# Patient Record
Sex: Female | Born: 1990 | Race: Black or African American | Hispanic: No | Marital: Single | Smoking: Current every day smoker
Health system: Southern US, Community
[De-identification: ages and names within clinical notes are randomized; demographics above are authoritative.]

## PROBLEM LIST (undated history)

## (undated) ENCOUNTER — Inpatient Hospital Stay (HOSPITAL_COMMUNITY): Payer: Self-pay

## (undated) DIAGNOSIS — A539 Syphilis, unspecified: Secondary | ICD-10-CM

## (undated) DIAGNOSIS — F419 Anxiety disorder, unspecified: Secondary | ICD-10-CM

## (undated) DIAGNOSIS — K219 Gastro-esophageal reflux disease without esophagitis: Secondary | ICD-10-CM

## (undated) DIAGNOSIS — A549 Gonococcal infection, unspecified: Secondary | ICD-10-CM

## (undated) DIAGNOSIS — F32A Depression, unspecified: Secondary | ICD-10-CM

## (undated) DIAGNOSIS — A599 Trichomoniasis, unspecified: Secondary | ICD-10-CM

## (undated) DIAGNOSIS — F99 Mental disorder, not otherwise specified: Secondary | ICD-10-CM

## (undated) DIAGNOSIS — R519 Headache, unspecified: Secondary | ICD-10-CM

## (undated) HISTORY — PX: TOOTH EXTRACTION: SUR596

## (undated) HISTORY — DX: Anxiety disorder, unspecified: F41.9

## (undated) HISTORY — DX: Gastro-esophageal reflux disease without esophagitis: K21.9

## (undated) HISTORY — DX: Headache, unspecified: R51.9

## (undated) HISTORY — DX: Depression, unspecified: F32.A

---
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
## 2009-05-18 ENCOUNTER — Telehealth: Payer: Self-pay | Admitting: *Deleted

## 2010-05-23 ENCOUNTER — Encounter: Payer: Self-pay | Admitting: Family Medicine

## 2010-06-03 NOTE — Progress Notes (Signed)
Summary: resch?  Phone Note Call from Patient Call back at [phone]   Caller: Patient Summary of Call: missed appt this morning and wants to know if she can reschedule Initial call taken by: De Nurse,  May 18, 2009 2:06 PM  Follow-up for Phone Call        Sure but please let her know that she is now on probation status and will be getting a letter from [place] soon about this. Follow-up by: Dennison Nancy RN,  May 18, 2009 4:26 PM
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
## 2010-11-16 ENCOUNTER — Inpatient Hospital Stay (HOSPITAL_COMMUNITY): Payer: Self-pay

## 2010-11-16 ENCOUNTER — Inpatient Hospital Stay (HOSPITAL_COMMUNITY)
Admission: AD | Admit: 2010-11-16 | Discharge: 2010-11-16 | Disposition: A | Discharge status: Home / Self Care | Payer: Self-pay | Source: Ambulatory Visit | Attending: Obstetrics & Gynecology | Admitting: Obstetrics & Gynecology

## 2010-11-16 ENCOUNTER — Encounter (HOSPITAL_COMMUNITY): Payer: Self-pay

## 2010-11-16 DIAGNOSIS — A599 Trichomoniasis, unspecified: Secondary | ICD-10-CM

## 2010-11-16 DIAGNOSIS — O99891 Other specified diseases and conditions complicating pregnancy: Secondary | ICD-10-CM | POA: Insufficient documentation

## 2010-11-16 DIAGNOSIS — R109 Unspecified abdominal pain: Secondary | ICD-10-CM

## 2010-11-16 DIAGNOSIS — R1031 Right lower quadrant pain: Secondary | ICD-10-CM | POA: Insufficient documentation

## 2010-11-16 HISTORY — DX: Trichomoniasis, unspecified: A59.9

## 2010-11-16 HISTORY — DX: Syphilis, unspecified: A53.9

## 2010-11-16 HISTORY — DX: Gonococcal infection, unspecified: A54.9

## 2010-11-16 LAB — DIFFERENTIAL
Basophils Absolute: 0 10*3/uL (ref 0.0–0.1)
Lymphocytes Relative: 46 % (ref 12–46)
Lymphs Abs: 2.2 10*3/uL (ref 0.7–4.0)
Neutro Abs: 1.8 10*3/uL (ref 1.7–7.7)

## 2010-11-16 LAB — URINALYSIS, ROUTINE W REFLEX MICROSCOPIC
Bilirubin Urine: NEGATIVE
Nitrite: NEGATIVE
Specific Gravity, Urine: >1.03 — ABNORMAL HIGH (ref 1.005–1.030)
Urobilinogen, UA: 1 mg/dL (ref 0.0–1.0)
pH: 6 (ref 5.0–8.0)

## 2010-11-16 LAB — POCT PREGNANCY, URINE: Preg Test, Ur: POSITIVE

## 2010-11-16 LAB — HCG, QUANTITATIVE, PREGNANCY: hCG, Beta Chain, Quant, S: 104915 m[IU]/mL — ABNORMAL HIGH (ref <5)

## 2010-11-16 LAB — URINE MICROSCOPIC-ADD ON

## 2010-11-16 LAB — WET PREP, GENITAL
Trich, Wet Prep: NONE SEEN
Yeast Wet Prep HPF POC: NONE SEEN

## 2010-11-16 LAB — CBC
Platelets: 235 10*3/uL (ref 150–400)
RBC: 3.88 MIL/uL (ref 3.87–5.11)
RDW: 14.2 % (ref 11.5–15.5)
WBC: 4.6 10*3/uL (ref 4.0–10.5)

## 2010-11-16 MED ORDER — METRONIDAZOLE 500 MG PO TABS: 500.0000 mg | ORAL_TABLET | Freq: Two times a day (BID) | ORAL | Status: AC

## 2010-11-16 MED ORDER — PROMETHAZINE HCL 25 MG PO TABS: 25.0000 mg | ORAL_TABLET | Freq: Four times a day (QID) | ORAL | Status: DC | PRN
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
## 2010-11-16 NOTE — Progress Notes (Signed)
Patient is here with c/o nausea and lower abdominal cramping. She denies any vaginal spotting, bleeding or discharge.

## 2010-11-16 NOTE — ED Provider Notes (Addendum)
History     Chief Complaint  Patient presents with  . Abdominal Pain   HPI The patient states that about a month ago she went to the ER in [place] and had a pos pregnancy test. Then they did blood work and told her she may be 6 or 8 wks. Pregnant. No ultrasound at that time. Patient has not started [facility]. Here today with abdominal cramping in the RLQ that has been off and on for the past couple days. Denies vaginal bleeding.  No past medical history on file.  No past surgical history on file.  No family history on file.  History  Substance Use Topics  . Smoking status: Not on file  . Smokeless tobacco: Not on file  . Alcohol Use: Not on file    OB History    Grav Para Term Preterm Abortions TAB SAB Ect Mult Living   1               Review of Systems  Constitutional: Positive for fatigue. Negative for fever, chills and diaphoresis.  HENT: Negative for ear pain, congestion, sore throat, facial swelling, neck pain, neck stiffness, dental problem and sinus pressure.   Eyes: Negative for photophobia, pain and discharge.  Respiratory: Positive for cough. Negative for chest tightness and wheezing.   Cardiovascular: Negative for chest pain and leg swelling.  Gastrointestinal: Negative for nausea, vomiting, abdominal pain, diarrhea, constipation and abdominal distention.  Genitourinary: Negative for dysuria, frequency, flank pain and difficulty urinating.  Musculoskeletal: Positive for back pain. Negative for myalgias and gait problem.  Skin: Negative for color change and rash.  Neurological: Negative for dizziness, speech difficulty, weakness, light-headedness, numbness and headaches.  Psychiatric/Behavioral: Negative for confusion and agitation.    Physical Exam  BP 123/68  Pulse 75  Temp(Src) 98.6 F (37 C) (Oral)  Resp 16  Ht 5\' 8"  (1.727 m)  Wt 217 lb 3.2 oz (98.521 kg)  BMI 33.03 kg/m2  SpO2 99%  LMP 09/15/2010  Physical Exam  Nursing note and vitals  reviewed. Constitutional: She is oriented to person, place, and time. She appears well-developed and well-nourished.  HENT:  Head: Normocephalic.  Eyes: EOM are normal.  Neck: Neck supple.  Pulmonary/Chest: Effort normal.  Abdominal: Soft. There is no tenderness.  Genitourinary: Uterus is enlarged. Right adnexum displays tenderness. Vaginal discharge found.       Uterus approx. 10 week size.  Musculoskeletal: Normal range of motion.  Neurological: She is alert and oriented to person, place, and time. No cranial nerve deficit.  Skin: Skin is warm and dry.    ED Course  Procedures  MDM Pt. With abdominal cramping. Ultrasound shows IUP with cardiac activiy at 10 wks. Gest. Pt. To start [facility].       [place], [place] 12/02/10 [phone]

## 2011-01-16 ENCOUNTER — Encounter (HOSPITAL_COMMUNITY): Payer: Self-pay | Admitting: *Deleted

## 2011-01-16 ENCOUNTER — Inpatient Hospital Stay (HOSPITAL_COMMUNITY)
Admission: AD | Admit: 2011-01-16 | Discharge: 2011-01-16 | Disposition: A | Discharge status: Home / Self Care | Payer: Self-pay | Source: Ambulatory Visit | Attending: Family Medicine | Admitting: Family Medicine

## 2011-01-16 DIAGNOSIS — A5901 Trichomonal vulvovaginitis: Secondary | ICD-10-CM | POA: Insufficient documentation

## 2011-01-16 DIAGNOSIS — J069 Acute upper respiratory infection, unspecified: Secondary | ICD-10-CM | POA: Insufficient documentation

## 2011-01-16 DIAGNOSIS — R05 Cough: Secondary | ICD-10-CM | POA: Insufficient documentation

## 2011-01-16 DIAGNOSIS — O98819 Other maternal infectious and parasitic diseases complicating pregnancy, unspecified trimester: Secondary | ICD-10-CM | POA: Insufficient documentation

## 2011-01-16 DIAGNOSIS — O99891 Other specified diseases and conditions complicating pregnancy: Secondary | ICD-10-CM | POA: Insufficient documentation

## 2011-01-16 DIAGNOSIS — R109 Unspecified abdominal pain: Secondary | ICD-10-CM | POA: Insufficient documentation

## 2011-01-16 DIAGNOSIS — R059 Cough, unspecified: Secondary | ICD-10-CM | POA: Insufficient documentation

## 2011-01-16 LAB — URINALYSIS, ROUTINE W REFLEX MICROSCOPIC
Leukocytes, UA: NEGATIVE
Nitrite: NEGATIVE
Specific Gravity, Urine: 1.01 (ref 1.005–1.030)
Urobilinogen, UA: 0.2 mg/dL (ref 0.0–1.0)
pH: 7 (ref 5.0–8.0)

## 2011-01-16 LAB — URINE MICROSCOPIC-ADD ON

## 2011-01-16 LAB — WET PREP, GENITAL: Yeast Wet Prep HPF POC: NONE SEEN

## 2011-01-16 MED ORDER — AZITHROMYCIN 250 MG PO TABS: ORAL_TABLET | ORAL | Status: DC

## 2011-01-16 MED ORDER — METRONIDAZOLE 500 MG PO TABS: 500.0000 mg | ORAL_TABLET | Freq: Two times a day (BID) | ORAL | Status: AC

## 2011-01-16 NOTE — Progress Notes (Signed)
Vomiting and diarrhea x 3 days also.

## 2011-01-16 NOTE — ED Provider Notes (Signed)
History     Chief Complaint  Patient presents with  . Cough  . Abdominal Pain   Patient is a 20 y.o. female presenting with cough and abdominal pain. The history is provided by the patient.  Cough This is a new problem. The current episode started more than 2 days ago. Pertinent negatives include no chills.  Abdominal Pain The primary symptoms of the illness include abdominal pain, fever, nausea and vomiting.  Symptoms associated with the illness do not include chills.  States her cough began 3 days ago sometimes thick yellow mucus.  Thinks it may be a cold.  Not sure if she has fever. She is afebrile at check in.   Has had nausea and vomiting  X 4 days.  G1 LMP Mid- May not using contraception. Last intercourse 2 weeks ago. She is 17+ weeks pregnant by previous ultrasound.  Plans care at [facility].  Applying for medicaid, needs letter.  Denies vaginal bleeding and discharge.  States she was seen here 1-2 months ago dx with Trich, did not fill rx.  Asking for another Rx for flagyl.       Past Medical History  Diagnosis Date  . Syphilis   . 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 Gonorrhea   . Trichomonas     Past Surgical History  Procedure Date  . No past surgeries     Family History  Problem Relation Age of Onset  . Diabetes Maternal Grandmother     History  Substance Use Topics  . Smoking status: Current Everyday Smoker -- 0.5 packs/day for 3 years    Types: Cigarettes  . Smokeless tobacco: Not on file  . Alcohol Use: No    Allergies: No Known Allergies  Prescriptions prior to admission  Medication Sig Dispense Refill  . prenatal vitamin w/FE, FA (PRENATAL 1 + 1) 27-1 MG TABS Take 1 tablet by mouth daily.          Review of Systems  Constitutional: Positive for fever. Negative for chills.  Respiratory: Positive for cough and sputum production.   Gastrointestinal: Positive for nausea, vomiting and abdominal pain.  Genitourinary:  Negative.        Denies vaginal discharge and bleeding.     Physical Exam   Last menstrual period 09/15/2010.  Physical Exam  Constitutional: She is oriented to person, place, and time. She appears well-developed and well-nourished.  HENT:  Head: Normocephalic.  Neck: Neck supple.  Respiratory: Effort normal and breath sounds normal. No respiratory distress. She has no wheezes. She has no rales.  GI: Soft.  Genitourinary: Uterus is enlarged (gravid measuring 17-18 week size.  nontender). Uterus is not tender. Cervix exhibits no motion tenderness and no discharge. Vaginal discharge (with odor) found.  Neurological: She is alert and oriented to person, place, and time.  Skin: Skin is warm and dry.  FHR 155  Results for orders placed during the hospital encounter of 01/16/11 (from the past 24 hour(s))  URINALYSIS, ROUTINE W REFLEX MICROSCOPIC     Status: Abnormal   Collection Time   01/16/11  2:50 PM      Component Value Range   Color, Urine YELLOW  YELLOW    Appearance CLEAR  CLEAR    Specific Gravity, Urine 1.010  1.005 - 1.030    pH 7.0  5.0 - 8.0    Glucose, UA NEGATIVE  NEGATIVE (mg/dL)   Hgb urine dipstick TRACE (*) NEGATIVE  Bilirubin Urine NEGATIVE  NEGATIVE    Ketones, ur NEGATIVE  NEGATIVE (mg/dL)   Protein, ur NEGATIVE  NEGATIVE (mg/dL)   Urobilinogen, UA 0.2  0.0 - 1.0 (mg/dL)   Nitrite NEGATIVE  NEGATIVE    Leukocytes, UA NEGATIVE  NEGATIVE   URINE MICROSCOPIC-ADD ON     Status: Normal   Collection Time   01/16/11  2:50 PM      Component Value Range   Squamous Epithelial / LPF RARE  RARE    RBC / HPF 0-2  <3 (RBC/hpf)   Urine-Other TRICHOMONAS PRESENT    WET PREP, GENITAL     Status: Abnormal   Collection Time   01/16/11  3:46 PM      Component Value Range   Yeast, Wet Prep NONE SEEN  NONE SEEN    Trich, Wet Prep NONE SEEN  NONE SEEN    Clue Cells, Wet Prep FEW (*) NONE SEEN    WBC, Wet Prep HPF POC FEW (*) NONE SEEN     MAU Course  Procedures  GC/Chl  culture to lab    Assessment and Plan  A Second trimester pregnancy with URI    Untreated Trichomonas    P: Rxs written for Flagyl and Z-pack.. Verification letter given. Instructions given for her to have her sexual partner treated before resuming intercourse to prevent re-infection to her.      KEY,EVE M 01/16/2011, 3:31 PM   Matt Holmes, NP 01/16/11 1639

## 2011-01-16 NOTE — Progress Notes (Signed)
Onset of cough, chills and pain in upper abdomen x 3 days [facility] 06/14/11

## 2011-01-16 NOTE — Progress Notes (Signed)
Pt reports upper abd pain, N/V, copugh and cold x 3 days. Requesting pregnancy varrification letter to help her get into a pregnancy home.

## 2011-01-16 NOTE — ED Provider Notes (Signed)
Chart reviewed and agree with management and plan.  

## 2011-01-17 LAB — GC/CHLAMYDIA PROBE AMP, GENITAL
Chlamydia, DNA Probe: NEGATIVE
GC Probe Amp, Genital: NEGATIVE

## 2011-02-28 ENCOUNTER — Other Ambulatory Visit (HOSPITAL_COMMUNITY): Payer: Self-pay | Admitting: Physician Assistant

## 2011-02-28 DIAGNOSIS — Z3689 Encounter for other specified antenatal screening: Secondary | ICD-10-CM

## 2011-02-28 LAB — RUBELLA ANTIBODY, IGM: Rubella: IMMUNE

## 2011-02-28 LAB — RPR: RPR: NONREACTIVE

## 2011-02-28 LAB — HIV ANTIBODY (ROUTINE TESTING W REFLEX): HIV: NONREACTIVE

## 2011-02-28 LAB — HEPATITIS B SURFACE ANTIGEN: Hepatitis B Surface Ag: NEGATIVE

## 2011-03-03 ENCOUNTER — Ambulatory Visit (HOSPITAL_COMMUNITY)
Admission: RE | Admit: 2011-03-03 | Discharge: 2011-03-03 | Disposition: A | Discharge status: Home / Self Care | Payer: Medicaid Other | Source: Ambulatory Visit | Attending: Physician Assistant | Admitting: Physician Assistant

## 2011-03-03 DIAGNOSIS — Z363 Encounter for antenatal screening for malformations: Secondary | ICD-10-CM | POA: Insufficient documentation

## 2011-03-03 DIAGNOSIS — Z1389 Encounter for screening for other disorder: Secondary | ICD-10-CM | POA: Insufficient documentation

## 2011-03-03 DIAGNOSIS — O0930 Supervision of pregnancy with insufficient antenatal care, unspecified trimester: Secondary | ICD-10-CM | POA: Insufficient documentation

## 2011-03-03 DIAGNOSIS — Z3689 Encounter for other specified antenatal screening: Secondary | ICD-10-CM

## 2011-03-03 DIAGNOSIS — O358XX0 Maternal care for other (suspected) fetal abnormality and damage, not applicable or unspecified: Secondary | ICD-10-CM | POA: Insufficient documentation

## 2011-05-03 NOTE — L&D Delivery Note (Signed)
Operative Delivery Note At 8:22 AM a viable female was delivered via Vaginal, Vacuum Investment banker, operational).  Presentation: vertex; Position: Left,, Occiput,, Anterior; Station: +4.  Verbal consent: obtained from patient.  Risks and benefits discussed in detail.  Risks include, but are not limited to the risks of anesthesia, bleeding, infection, damage to maternal tissues, fetal cephalhematoma.  There is also the risk of inability to effect vaginal delivery of the head, or shoulder dystocia that cannot be resolved by established maneuvers, leading to the need for emergency cesarean section.  APGAR: 8, 9; weight 5 lb 11 oz (2580 g).   Placenta status: Intact, Spontaneous.   Cord: 3 vessels with the following complications: None.  Cord pH: 7.22  Anesthesia: Epidural  Instruments: kiwi vac Episiotomy: None Lacerations: None Suture Repair:  Est. Blood Loss (mL): 250  Mom to postpartum.  Baby to nursery-stable.  Zerita Boers 06/05/2011, 9:49 AM

## 2011-06-04 ENCOUNTER — Encounter (HOSPITAL_COMMUNITY): Payer: Self-pay | Admitting: *Deleted

## 2011-06-04 ENCOUNTER — Encounter (HOSPITAL_COMMUNITY): Payer: Self-pay | Admitting: Anesthesiology

## 2011-06-04 ENCOUNTER — Inpatient Hospital Stay (HOSPITAL_COMMUNITY)
Admission: AD | Admit: 2011-06-04 | Discharge: 2011-06-07 | DRG: 775 | Disposition: A | Discharge status: Home / Self Care | Payer: Medicaid Other | Source: Ambulatory Visit | Attending: Family Medicine | Admitting: Family Medicine

## 2011-06-04 ENCOUNTER — Inpatient Hospital Stay (HOSPITAL_COMMUNITY): Payer: Medicaid Other | Admitting: Anesthesiology

## 2011-06-04 ENCOUNTER — Encounter (HOSPITAL_COMMUNITY): Payer: Self-pay | Admitting: Obstetrics and Gynecology

## 2011-06-04 ENCOUNTER — Inpatient Hospital Stay (HOSPITAL_COMMUNITY)
Admission: AD | Admit: 2011-06-04 | Discharge: 2011-06-04 | Disposition: A | Discharge status: Home / Self Care | Payer: Medicaid Other | Source: Ambulatory Visit | Attending: Family Medicine | Admitting: Family Medicine

## 2011-06-04 DIAGNOSIS — O4290 Premature rupture of membranes, unspecified as to length of time between rupture and onset of labor, unspecified weeks of gestation: Secondary | ICD-10-CM

## 2011-06-04 DIAGNOSIS — O99892 Other specified diseases and conditions complicating childbirth: Secondary | ICD-10-CM | POA: Diagnosis present

## 2011-06-04 DIAGNOSIS — Z22330 Carrier of Group B streptococcus: Secondary | ICD-10-CM

## 2011-06-04 DIAGNOSIS — O479 False labor, unspecified: Secondary | ICD-10-CM | POA: Insufficient documentation

## 2011-06-04 HISTORY — DX: Mental disorder, not otherwise specified: F99

## 2011-06-04 LAB — CBC
HCT: 40.3 % (ref 36.0–46.0)
MCHC: 34.7 g/dL (ref 30.0–36.0)
MCV: 93.7 fL (ref 78.0–100.0)
RDW: 13.2 % (ref 11.5–15.5)
WBC: 9.8 10*3/uL (ref 4.0–10.5)

## 2011-06-04 MED ORDER — EPHEDRINE 5 MG/ML INJ
10.0000 mg | INTRAVENOUS | Status: DC | PRN
  Filled 2011-06-04: qty 4

## 2011-06-04 MED ORDER — OXYCODONE-ACETAMINOPHEN 5-325 MG PO TABS: 1.0000 | ORAL_TABLET | ORAL | Status: DC | PRN

## 2011-06-04 MED ORDER — PENICILLIN G POTASSIUM 5000000 UNITS IJ SOLR
5.0000 10*6.[IU] | Freq: Once | INTRAVENOUS | Status: AC
  Administered 2011-06-04: 5 10*6.[IU] via INTRAVENOUS
  Filled 2011-06-04: qty 5

## 2011-06-04 MED ORDER — OXYTOCIN BOLUS FROM INFUSION
500.0000 mL | Freq: Once | INTRAVENOUS | Status: DC
  Filled 2011-06-04: qty 500
  Filled 2011-06-04: qty 1000

## 2011-06-04 MED ORDER — LACTATED RINGERS IV SOLN
INTRAVENOUS | Status: DC
  Administered 2011-06-04 (×2): via INTRAVENOUS

## 2011-06-04 MED ORDER — PENICILLIN G POTASSIUM 5000000 UNITS IJ SOLR
2.5000 10*6.[IU] | INTRAVENOUS | Status: DC
  Administered 2011-06-05 (×3): 2.5 10*6.[IU] via INTRAVENOUS
  Filled 2011-06-04 (×6): qty 2.5

## 2011-06-04 MED ORDER — ONDANSETRON HCL 4 MG/2ML IJ SOLN
4.0000 mg | Freq: Four times a day (QID) | INTRAMUSCULAR | Status: DC | PRN
  Administered 2011-06-04: 4 mg via INTRAVENOUS
  Filled 2011-06-04: qty 2

## 2011-06-04 MED ORDER — LIDOCAINE HCL (PF) 1 % IJ SOLN: 30.0000 mL | INTRAMUSCULAR | Status: DC | PRN

## 2011-06-04 MED ORDER — FENTANYL 2.5 MCG/ML BUPIVACAINE 1/10 % EPIDURAL INFUSION (WH - ANES)
INTRAMUSCULAR | Status: DC | PRN
  Administered 2011-06-04: 14 mL/h via EPIDURAL

## 2011-06-04 MED ORDER — EPHEDRINE 5 MG/ML INJ: 10.0000 mg | INTRAVENOUS | Status: DC | PRN

## 2011-06-04 MED ORDER — ACETAMINOPHEN 325 MG PO TABS: 650.0000 mg | ORAL_TABLET | ORAL | Status: DC | PRN

## 2011-06-04 MED ORDER — CITRIC ACID-SODIUM CITRATE 334-500 MG/5ML PO SOLN: 30.0000 mL | ORAL | Status: DC | PRN

## 2011-06-04 MED ORDER — LACTATED RINGERS IV SOLN: 500.0000 mL | INTRAVENOUS | Status: DC | PRN

## 2011-06-04 MED ORDER — SODIUM BICARBONATE 8.4 % IV SOLN
INTRAVENOUS | Status: DC | PRN
  Administered 2011-06-04: 4 mL via EPIDURAL

## 2011-06-04 MED ORDER — LACTATED RINGERS IV SOLN: 500.0000 mL | Freq: Once | INTRAVENOUS | Status: DC

## 2011-06-04 MED ORDER — FLEET ENEMA 7-19 GM/118ML RE ENEM: 1.0000 | ENEMA | RECTAL | Status: DC | PRN

## 2011-06-04 MED ORDER — IBUPROFEN 600 MG PO TABS: 600.0000 mg | ORAL_TABLET | Freq: Four times a day (QID) | ORAL | Status: DC | PRN

## 2011-06-04 MED ORDER — FENTANYL 2.5 MCG/ML BUPIVACAINE 1/10 % EPIDURAL INFUSION (WH - ANES)
14.0000 mL/h | INTRAMUSCULAR | Status: DC
  Administered 2011-06-05 (×2): 14 mL/h via EPIDURAL
  Filled 2011-06-04 (×3): qty 60

## 2011-06-04 MED ORDER — PHENYLEPHRINE 40 MCG/ML (10ML) SYRINGE FOR IV PUSH (FOR BLOOD PRESSURE SUPPORT)
80.0000 ug | PREFILLED_SYRINGE | INTRAVENOUS | Status: DC | PRN
  Filled 2011-06-04: qty 5

## 2011-06-04 MED ORDER — DIPHENHYDRAMINE HCL 50 MG/ML IJ SOLN: 12.5000 mg | INTRAMUSCULAR | Status: DC | PRN

## 2011-06-04 MED ORDER — OXYTOCIN 20 UNITS IN LACTATED RINGERS INFUSION - SIMPLE
125.0000 mL/h | Freq: Once | INTRAVENOUS | Status: AC
  Administered 2011-06-05: 125 mL/h via INTRAVENOUS

## 2011-06-04 MED ORDER — PHENYLEPHRINE 40 MCG/ML (10ML) SYRINGE FOR IV PUSH (FOR BLOOD PRESSURE SUPPORT): 80.0000 ug | PREFILLED_SYRINGE | INTRAVENOUS | Status: DC | PRN

## 2011-06-04 NOTE — H&P (Signed)
Makayla Kramer is a 21 y.o. female G1P0 with 86.4 presenting for premature ruptures of membranes. Pt that came today in the morning to MAU with painful contractions and on cervical exam was1cm long, posterior station -3. She comes now transported by EMS for presenting more painful contractions and whoosh of light meconium tinted fluid per vagina at 6:30pm. No bleeding. Afebrile. No headaches, epigastric discomfort, vision disturbances or edema.  Pt of [facility] [place]. with late prenatal care. Hx of STD's and during this pregnancy Positive for Trichomonas treated with Metronidazole and most recently Chlamydia with re-exposure: Treated with Azithromycin twice.  Hx of Marijuana use in pregnancy and refused UDS.  Maternal Medical History:  Reason for admission: Reason for Admission:   nauseaContractions: Onset was 1-2 hours ago.      OB History    Grav Para Term Preterm Abortions TAB SAB Ect Mult Living   1              Past Medical History  Diagnosis Date  . Syphilis   . 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 Gonorrhea   . Trichomonas    Past Surgical History  Procedure Date  . No past surgeries   . Tooth extraction    Family History: family history includes Diabetes in her maternal grandmother. Social History:  reports that she has been smoking Cigarettes.  She has a .75 pack-year smoking history. She has never used smokeless tobacco. She reports that she uses illicit drugs (Marijuana). She reports that she does not drink alcohol.  Review of Systems  Constitutional: Negative for fever and chills.  Eyes: Negative for blurred vision.  Respiratory: Negative for cough.   Cardiovascular: Negative for chest pain and palpitations.  Gastrointestinal: Positive for abdominal pain. Negative for nausea and vomiting.  Genitourinary: Negative.  Negative for dysuria and urgency.  Musculoskeletal: Negative.   Skin: Negative for rash.  Neurological: Negative.  Negative  for headaches.    Dilation: 2 Effacement (%): 70 Station: -2 Exam by:: Makayla Mora, RN Blood pressure 138/70, pulse 58, temperature 99.2 F (37.3 C), temperature source Oral, resp. rate 20, last menstrual period 09/15/2010. Exam Physical Exam  Constitutional: She is oriented to person, place, and time. She appears distressed.       Due to painful contractions  HENT:  Mouth/Throat: Oropharynx is clear and moist.  Cardiovascular: Normal rate and normal heart sounds.   Respiratory: No respiratory distress.  GI: Bowel sounds are normal.       Normal abdominal exam of a 38 w gravid pt.  Genitourinary:       Meconium tinted amniotic fluid per vagina.  No active lesions of HSV seen on vulva.  Musculoskeletal: She exhibits no edema.  Neurological: She is alert and oriented to person, place, and time. She has normal reflexes.    Prenatal labs: ABO, Rh:  O positive Antibody:  negative Rubella:  and varicella inmmune RPR:   neg HBsAg:  neg HIV:    declined GBS:   Positive HSV treated with Valtrex.  GTT 1h 34,  UDS Refused  Assessment/Plan:  Makayla Kramer is a 21 y.o. female G1P0 with 38.4 presenting for premature ruptures of membranes with light meconium. 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  Plan:  1. Admit to BS 2. Routine L&D orders 3. Penicillin G per GBS protocol 4. SW consult 5. Analgesia/anesthesia PRN    D. Piloto [employer]. MD PGY-1 06/04/2011, 7:22  PM

## 2011-06-04 NOTE — Progress Notes (Signed)
Pt reports water broke 1830. Light mech stained observed. reprots ctx q 5 min. Here earlier and 1cm dialated.

## 2011-06-04 NOTE — Anesthesia Procedure Notes (Signed)

## 2011-06-04 NOTE — Anesthesia Preprocedure Evaluation (Addendum)
Anesthesia Evaluation  Patient identified by MRN, date of birth, ID band Patient awake    Reviewed: Allergy & Precautions, H&P , Patient's Chart, lab work & pertinent test results  Airway Mallampati: III TM Distance: >3 FB Neck ROM: full    Dental  (+) Teeth Intact   Pulmonary  clear to auscultation        Cardiovascular regular Normal    Neuro/Psych PSYCHIATRIC DISORDERS    GI/Hepatic   Endo/Other    Renal/GU      Musculoskeletal   Abdominal   Peds  Hematology   Anesthesia Other Findings       Reproductive/Obstetrics (+) Pregnancy                          Anesthesia Physical Anesthesia Plan  ASA: II  Anesthesia Plan: Epidural   Post-op Pain Management:    Induction:   Airway Management Planned:   Additional Equipment:   Intra-op Plan:   Post-operative Plan:   Informed Consent: I have reviewed the patients History and Physical, chart, labs and discussed the procedure including the risks, benefits and alternatives for the proposed anesthesia with the patient or authorized representative who has indicated his/her understanding and acceptance.   Dental Advisory Given  Plan Discussed with:   Anesthesia Plan Comments: (Labs checked- platelets confirmed with RN in room. Fetal heart tracing, per RN, reported to be stable enough for sitting procedure. Discussed epidural, and patient consents to the procedure:  included risk of possible headache,backache, failed block, allergic reaction, and nerve injury. This patient was asked if she had any questions or concerns before the procedure started. )        Anesthesia Quick Evaluation

## 2011-06-04 NOTE — Progress Notes (Signed)
Patient ID: Makayla Kramer, female   DOB: 14-Nov-1990, 21 y.o.   MRN: [id number] Makayla Kramer is a 21 y.o. G1P0 at [redacted]w[redacted]d, admitted for SROM  Subjective: Comfortable after epidural.  Objective: BP 114/52  Pulse 55  Temp(Src) 98.2 F (36.8 C) (Oral)  Resp 18  Ht 5\' 11"  (1.803 m)  Wt 108.863 kg (240 lb)  BMI 33.47 kg/m2  LMP 09/15/2010  Fetal Heart Rate: 140 Variability: moderate Accelerations: 15x15 Decelerations: none  Contractions: not tracing well; q 2-4 min  SVE:   Dilation: 4.5 Effacement (%): 80 Station: -2 Exam by:: B.Cagna,RN @2200  Now $+/90/-2 vtx,  mucusy light MSAF  Assessment / Plan: Labor: Early active Fetal Wellbeing: Cat 1 FHR Pain Control:  Adequate with epidural  Continue expectant management.    Makayla Kramer 06/04/2011, 11:15 PM

## 2011-06-04 NOTE — Progress Notes (Signed)
C/o sharp lower abd pain with cramping

## 2011-06-04 NOTE — ED Provider Notes (Signed)
Chart reviewed and agree with management and plan.  

## 2011-06-04 NOTE — H&P (Signed)
Chart reviewed and agree with management and plan.  

## 2011-06-04 NOTE — Progress Notes (Signed)
Negatvie GBS result entered in error, result changed to positive.  However, unable to delete incorrect GBS result.

## 2011-06-04 NOTE — ED Provider Notes (Signed)
History     Chief Complaint  Patient presents with  . Labor Eval   HPI Pt is 21 y/o F G1P0 with 38.4 weeks that comes to MAU for painful contractions that stated today in the morning. No discharge, mucus plug, whoosh of fluid nor bleeding per vagina. No headache, epigastric pain or edema. No other complaints. Pt of [facility] [place]. Hx of STD's and during this pregnancy Positive for Trichomonas treated with Metronidazole and most recently Chlamydia with re-exposure: Treated with Azithromycin twice.  Positive GBS  HSV treated with Valtrex. GTT 1h 34,  O positive, Varicella  and Rubella immune.   OB History    Grav Para Term Preterm Abortions TAB SAB Ect Mult Living   1               Past Medical History  Diagnosis Date  . Syphilis   . 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 Gonorrhea   . Trichomonas     Past Surgical History  Procedure Date  . No past surgeries   . Tooth extraction     Family History  Problem Relation Age of Onset  . Diabetes Maternal Grandmother     History  Substance Use Topics  . Smoking status: Current Everyday Smoker -- 0.2 packs/day for 3 years    Types: Cigarettes  . Smokeless tobacco: Never Used  . Alcohol Use: No    Allergies: No Known Allergies  Prescriptions prior to admission  Medication Sig Dispense Refill  . Prenatal Vit-Fe Fumarate-FA (PRENATAL MULTIVITAMIN) TABS Take 1 tablet by mouth daily.        Review of Systems  Constitutional: Negative for fever and chills.  Eyes: Negative for blurred vision.  Respiratory: Negative for cough.   Cardiovascular: Negative for chest pain and palpitations.  Gastrointestinal: Positive for abdominal pain. Negative for nausea, vomiting and diarrhea.       Abdominal pain with contractions.  Genitourinary: Negative.  Negative for dysuria.  Musculoskeletal: Negative for back pain.  Neurological: Negative.  Negative for headaches.   Physical Exam   Blood pressure 125/68,  pulse 62, temperature 98 F (36.7 C), temperature source Oral, resp. rate 20, last menstrual period 09/15/2010.  Physical Exam  Constitutional: She is oriented to person, place, and time. She appears distressed.       Mildly distressed when she has contractions due to pain.   HENT:  Mouth/Throat: Oropharynx is clear and moist.  Eyes: Conjunctivae are normal.  Neck: Neck supple.  Cardiovascular: Normal rate, regular rhythm and normal heart sounds.   No murmur heard. Respiratory: Breath sounds normal. No respiratory distress.  GI: Bowel sounds are normal.       Normal abdominal exam of a 39 w. pregnant pt   Genitourinary:       Cervix 1cm long, posterior station -3  Musculoskeletal: She exhibits no edema.  Neurological: She is alert and oriented to person, place, and time. She has normal reflexes.    MAU Course  Procedures  MDM FHT category 1 and contractions irregular 8-10  Assessment and Plan  Pt is 21 y/o F G1P0 with 38.4 weeks with painful contractions most likely beginning of  latent phase of labor.  Reassuring FHT, no regular contractions, cervix with minimal changes. No other symptoms or signs. Plan: Discussed labor symptoms /signs and discharge home.  Pt will keep next scheduled appointment HD.  D. Piloto Sherron [employer]. MD PGY-1 06/04/2011, 11:33 AM

## 2011-06-05 ENCOUNTER — Other Ambulatory Visit (HOSPITAL_COMMUNITY): Payer: Self-pay | Admitting: Obstetrics and Gynecology

## 2011-06-05 ENCOUNTER — Encounter (HOSPITAL_COMMUNITY): Payer: Self-pay | Admitting: *Deleted

## 2011-06-05 DIAGNOSIS — Z22330 Carrier of Group B streptococcus: Secondary | ICD-10-CM

## 2011-06-05 DIAGNOSIS — O9989 Other specified diseases and conditions complicating pregnancy, childbirth and the puerperium: Secondary | ICD-10-CM

## 2011-06-05 MED ORDER — WITCH HAZEL-GLYCERIN EX PADS: 1.0000 "application " | MEDICATED_PAD | CUTANEOUS | Status: DC | PRN

## 2011-06-05 MED ORDER — OXYCODONE-ACETAMINOPHEN 5-325 MG PO TABS: 1.0000 | ORAL_TABLET | ORAL | Status: DC | PRN

## 2011-06-05 MED ORDER — TETANUS-DIPHTH-ACELL PERTUSSIS 5-2.5-18.5 LF-MCG/0.5 IM SUSP: 0.5000 mL | Freq: Once | INTRAMUSCULAR | Status: DC

## 2011-06-05 MED ORDER — SENNOSIDES-DOCUSATE SODIUM 8.6-50 MG PO TABS
2.0000 | ORAL_TABLET | Freq: Every day | ORAL | Status: DC
  Administered 2011-06-06: 2 via ORAL

## 2011-06-05 MED ORDER — OXYTOCIN 20 UNITS IN LACTATED RINGERS INFUSION - SIMPLE
2.0000 m[IU]/min | INTRAVENOUS | Status: DC
  Administered 2011-06-05: 2 m[IU]/min via INTRAVENOUS

## 2011-06-05 MED ORDER — PRENATAL MULTIVITAMIN CH
1.0000 | ORAL_TABLET | Freq: Every day | ORAL | Status: DC
  Administered 2011-06-06 – 2011-06-07 (×2): 1 via ORAL
  Filled 2011-06-05 (×2): qty 1

## 2011-06-05 MED ORDER — LANOLIN HYDROUS EX OINT: TOPICAL_OINTMENT | CUTANEOUS | Status: DC | PRN

## 2011-06-05 MED ORDER — DIBUCAINE 1 % RE OINT: 1.0000 "application " | TOPICAL_OINTMENT | RECTAL | Status: DC | PRN

## 2011-06-05 MED ORDER — SIMETHICONE 80 MG PO CHEW: 80.0000 mg | CHEWABLE_TABLET | ORAL | Status: DC | PRN

## 2011-06-05 MED ORDER — OXYTOCIN 20 UNITS IN LACTATED RINGERS INFUSION - SIMPLE: 125.0000 mL/h | INTRAVENOUS | Status: DC

## 2011-06-05 MED ORDER — ZOLPIDEM TARTRATE 5 MG PO TABS: 5.0000 mg | ORAL_TABLET | Freq: Every evening | ORAL | Status: DC | PRN

## 2011-06-05 MED ORDER — TERBUTALINE SULFATE 1 MG/ML IJ SOLN: 0.2500 mg | Freq: Once | INTRAMUSCULAR | Status: DC | PRN

## 2011-06-05 MED ORDER — LACTATED RINGERS IV SOLN
INTRAVENOUS | Status: DC
  Administered 2011-06-05: 06:00:00 via INTRAUTERINE

## 2011-06-05 MED ORDER — ONDANSETRON HCL 4 MG PO TABS: 4.0000 mg | ORAL_TABLET | ORAL | Status: DC | PRN

## 2011-06-05 MED ORDER — DIPHENHYDRAMINE HCL 25 MG PO CAPS: 25.0000 mg | ORAL_CAPSULE | Freq: Four times a day (QID) | ORAL | Status: DC | PRN

## 2011-06-05 MED ORDER — BENZOCAINE-MENTHOL 20-0.5 % EX AERO
1.0000 "application " | INHALATION_SPRAY | CUTANEOUS | Status: DC | PRN
  Administered 2011-06-07: 1 via TOPICAL

## 2011-06-05 MED ORDER — ONDANSETRON HCL 4 MG/2ML IJ SOLN: 4.0000 mg | INTRAMUSCULAR | Status: DC | PRN

## 2011-06-05 MED ORDER — IBUPROFEN 600 MG PO TABS
600.0000 mg | ORAL_TABLET | Freq: Four times a day (QID) | ORAL | Status: DC
  Administered 2011-06-05 – 2011-06-07 (×7): 600 mg via ORAL
  Filled 2011-06-05 (×7): qty 1

## 2011-06-05 NOTE — Progress Notes (Signed)
Patient ID: Makayla Kramer, female   DOB: 1990/05/23, 21 y.o.   MRN: [id number] Makayla Kramer is a 21 y.o. G1P0 at [redacted]w[redacted]d, admitted for early labor, MSAF  Subjective: No urge to push  Objective: BP 106/30  Pulse 55  Temp(Src) 98.2 F (36.8 C) (Oral)  Resp 20  Ht 5\' 11"  (1.803 m)  Wt 108.863 kg (240 lb)  BMI 33.47 kg/m2  LMP 09/15/2010  Fetal Heart Rate: 145 Variability: moderate Accelerations: present Decelerations: resolved with amnioinfusion  Contractions: q3-4  SVE:   Dilation: 10 Effacement (%): 100 Station: +2 Exam by:: diedre Iracema Lanagan cnm Clear fluid return  Assessment / Plan: Labor: start active 2nd stageg Fetal Wellbeing: Cat 1 Pain Control:  optimal   Meris Reede 06/05/2011, 7:44 AM

## 2011-06-05 NOTE — Progress Notes (Signed)
Received referral for LCSW consult this am.  I followed up with Pediatrician who had examined baby and spoke with MOB.  She reported MOB was appropriate and engaged.  She reported lots of family and friends present.  Plan for LCSW to visit at next opportunity.  Staci Acosta, LCSW, 06/05/2011, 2:50 pm

## 2011-06-05 NOTE — Progress Notes (Signed)
Patient ID: Makayla Kramer, female   DOB: [birth year], 21 y.o.   MRN: [id number] Variables resolved after amnioinfusion

## 2011-06-05 NOTE — Progress Notes (Signed)
Patient ID: Makayla Kramer, female   DOB: 10-22-1990, 21 y.o.   MRN: [id number] Makayla Kramer is a 21 y.o. G1P0 at [redacted]w[redacted]d  Subjective: Comfortable  Objective: BP 119/68  Pulse 60  Temp(Src) 98.5 F (36.9 C) (Oral)  Resp 18  Ht 5\' 11"  (1.803 m)  Wt 108.863 kg (240 lb)  BMI 33.47 kg/m2  LMP 09/15/2010  Fetal Heart Rate: 130 Variability: moderate Accelerations: present Decelerations: none Contractions: not tracing consistently, q4-15min  SVE:   Dilation: 5 Effacement (%): 90 Station: -2 Exam by:: B.cagna,RN  VE: 4.5/90/-2 AROM forebag-> watery meconium. IUPC placed without difficulty  Assessment / Plan: Labor: early active with inadequate UCs Fetal Wellbeing: Category 1 Pain Control:  Adequate Will begin pitocin augmentation   Makayla Kramer 06/05/2011, 1:17 AM

## 2011-06-05 NOTE — Progress Notes (Signed)
Called CNM to report cervical change, uc pattern, and fhr.

## 2011-06-05 NOTE — Progress Notes (Signed)
Linens changed, pericare done.

## 2011-06-05 NOTE — Progress Notes (Signed)
Called D. Poe, CNM to report prolonged decel, cervix, pitocin off and MVU's.  Orders received.

## 2011-06-05 NOTE — Progress Notes (Signed)
Patient ID: Makayla Kramer, female   DOB: 03/04/1991, 21 y.o.   MRN: [id number] Makayla Kramer is a 21 y.o. G1P0 at [redacted]w[redacted]d, admitted for SROM  Subjective: Comfortable  Objective: BP 99/35  Pulse 50  Temp(Src) 98.2 F (36.8 C) (Oral)  Resp 20  Ht 5\' 11"  (1.803 m)  Wt 108.863 kg (240 lb)  BMI 33.47 kg/m2  LMP 09/15/2010  Fetal Heart Rate: 140 Variability: moderate Accelerations: present  Decelerations: variables to 80-90 with UCs persisting despite pitocin discontinued, repositioned, IVF bolus and oxygen   Contractions: q2-4 with MVUs 250-300  SVE:   Dilation: 8 Effacement (%): 90 Station: 0 Exam by:: B.cagna,RN  My exam: 8/100/0, LOA, +FSS, scant meconium-tinged watery fluid  Assessment / Plan: Labor: Progressive active phase with repetitive variables and meconium Fetal Wellbeing: Category 2 FHR Pain Control:  Adequate  Will amnioinfuse   POE,DEIRDRE 06/05/2011, 5:41 AM

## 2011-06-06 NOTE — Progress Notes (Signed)
Post Partum Day 1 Subjective: no complaints, up ad lib, voiding and tolerating PO  Objective: Blood pressure 108/63, pulse 62, temperature 97.8 F (36.6 C), temperature source Oral, resp. rate 18, height 5\' 11"  (1.803 m), weight 108.863 kg (240 lb), last menstrual period 09/15/2010, SpO2 99.00%, unknown if currently breastfeeding.  Physical Exam:  General: alert, cooperative, appears stated age and no distress Lochia: appropriate Uterine Fundus: firm Incision: n/a DVT Evaluation: No evidence of DVT seen on physical exam. Negative Homan's sign. No cords or calf tenderness. No significant calf/ankle edema.   Basename 06/04/11 2024  HGB 14.0  HCT 40.3    Assessment/Plan: Plan for discharge tomorrow   LOS: 2 days   Makayla Kramer 06/06/2011, 6:46 AM

## 2011-06-06 NOTE — Progress Notes (Signed)
PSYCHOSOCIAL ASSESSMENT ~ MATERNAL/CHILD  Name: Makayla Kramer Age: 21  Referral Date: 06/06/11  Reason/Source: Substance use / CN  I. FAMILY/HOME ENVIRONMENT  A. Child's Legal Guardian _X__Parent(s) ___Grandparent ___Foster parent ___DSS_________________  Name: Makayla Kramer DOB: // Age: 20  Address: [address] ; [place], [place] [address]  Name: (not disclosed) DOB: // Age:  Address:  B. Other Household Members/Support Persons Name: Makayla Kramer Relationship: cousin DOB ___/___/___  Name: Relationship: cousin 2yr DOB ___/___/___  Name: Relationship: DOB ___/___/___  Name: Relationship: DOB ___/___/___  C. Other Support:  II. PSYCHOSOCIAL DATA A. Information Source _X_Patient Interview __Family Interview __Other___________ B. Financial and Community Kramer __Employment:  _X_Medicaid County: __Private Makayla: __Self Pay  __Food Stamps* pending __WIC __Work First __Public Housing __Section 8  _X_Maternity Care Coordination/Child Service Coordination/Early Intervention: Makayla Kramer  ___School: Grade:  __Other:  C. Cultural and Environment Information Cultural Issues Impacting Care:  III. STRENGTHS _X__Supportive family/friends  _X__Adequate Kramer  ___Compliance with medical plan  _X__Home prepared for Child (including basic supplies)  ___Understanding of illness  ___Other:  RISK FACTORS AND CURRENT PROBLEMS ____No Problems Noted  History MJ use  Bipolar  Limited PNC  IV. SOCIAL WORK ASSESSMENT Sw met with 20 year old, G1P1 referred for history of marijuana use and bipolar disorder. Pt admits to smoking MJ "every 2 to 3 days" prior to pregnancy confirmation at 2 months. Once pregnancy was confirmed, she decreased use to " 2 times a week." The last time she smoked was " a week or 2" before delivery. She told Sw that she had to smoke to help increase her appetite. She denies other illegal substance use. UDS is negative and meconium is pending. Pt  acknowledges that she was diagnosed with bipolar disorder at 12 and prescribed medication, of which she never took. Pt didn't think she needed the medication. She denies any recent or current depression, SI or HI. She had adequate family support and all the necessary supplies for the infant. She will follow up with drug screen and make referral if needed.  V. SOCIAL WORK PLAN _X__No Further Intervention Required/No Barriers to Discharge  ___Psychosocial Support and Ongoing Assessment of Needs  ___Patient/Family Education:  ___Child Protective Services Report County___________ Date___/____/____  ___Information/Referral to Community Resources_________________________  ___Other:     _X_Patient Interview  __Family Interview           __Other___________  B. Event organiser __Employment: _X_Medicaid    [place]:                 __Private Makayla:                   __Self Pay  __Food Stamps* pending   __WIC __Work First     __Public Housing     __Section 8    _X_Maternity Care Coordination/Child Service Coordination/Early Intervention: Makayla Kramer   ___School:                                                                         Grade:  __Other:   Makayla Kramer Cultural and Environment Information Cultural Issues Impacting Care:  III. STRENGTHS _X__Supportive family/friends _X__Adequate Kramer ___Compliance with medical plan _X__Home  prepared for Child (including basic supplies) ___Understanding of illness      ___Other: RISK FACTORS AND CURRENT PROBLEMS         ____No Problems Noted             History MJ use Bipolar Limited PNC                                                                                                                                                                                                                                        IV. SOCIAL WORK ASSESSMENT  Sw met with 55 year old, G1P1 referred for history of marijuana use and bipolar disorder.  Pt admits to smoking MJ "every 2 to 3 days" prior to pregnancy confirmation at 2 months.  Once pregnancy was confirmed, she decreased use to " 2 times a week."  The last time she smoked was " a week or 2" before delivery.  She told Sw that she had to smoke to help increase her appetite.  She denies other illegal substance use.  UDS is negative and meconium is pending.  Pt acknowledges that she was diagnosed with bipolar disorder at 19 and prescribed medication, of which she never took.  Pt didn't think  she needed the medication.  She denies any recent or current depression, SI or HI.  She had adequate family support and all the necessary supplies for the infant.  She will follow up with drug screen and make referral if needed.    V. SOCIAL WORK PLAN  _X__No Further Intervention Required/No Barriers to Discharge   ___Psychosocial Support and Ongoing Assessment of Needs   ___Patient/Family Education:   ___Child Protective Services Report   County___________ Date___/____/____   ___Information/Referral to Makayla Resources_________________________   ___Other:

## 2011-06-06 NOTE — Addendum Note (Signed)
Addendum  created 06/06/11 7846 by Fanny Dance, CRNA   Modules edited:Charges VN, Notes Section

## 2011-06-06 NOTE — Progress Notes (Signed)
UR chart review completed.  

## 2011-06-06 NOTE — Anesthesia Postprocedure Evaluation (Signed)
  Anesthesia Post-op Note  Patient: Makayla Kramer  Procedure(s) Performed: * No procedures listed *  Patient Location: 115  Anesthesia Type: Epidural  Level of Consciousness: alert  and oriented  Airway and Oxygen Therapy: Patient Spontanous Breathing  Post-op Pain: mild  Post-op Assessment: Patient's Cardiovascular Status Stable and Respiratory Function Stable  Post-op Vital Signs: stable  Complications: No apparent anesthesia complications

## 2011-06-07 MED ORDER — BENZOCAINE-MENTHOL 20-0.5 % EX AERO
INHALATION_SPRAY | CUTANEOUS | Status: AC
  Administered 2011-06-07: 1 via TOPICAL
  Filled 2011-06-07: qty 56

## 2011-06-07 MED ORDER — IBUPROFEN 600 MG PO TABS: 600.0000 mg | ORAL_TABLET | Freq: Four times a day (QID) | ORAL | Status: AC

## 2011-06-07 NOTE — Discharge Summary (Signed)
Obstetric Discharge Summary Reason for Admission: onset of labor Prenatal Procedures: none Intrapartum Procedures: vacuum Postpartum Procedures: none Complications-Operative and Postpartum: none Hemoglobin  Date Value Range Status  06/04/2011 14.0  12.0-15.0 (g/dL) Final     HCT  Date Value Range Status  06/04/2011 40.3  36.0-46.0 (%) Final    Discharge Diagnoses: Term Pregnancy-delivered  Discharge Information: Date: 06/07/2011 Activity: pelvic rest Diet: routine Medications: Ibuprofen Condition: stable Instructions: refer to practice specific booklet Discharge to: home Follow-up Information    Follow up with [facility] [place]. (Make appointment for 4-6 weeks postpartum)    Contact information:   [address] [place] [address]          Newborn Data: Live born female  Birth Weight: 5 lb 11 oz (2580 g) APGAR: 8, 9  Home with mother.  Makayla Kramer 06/07/2011, 7:35 AM

## 2011-06-07 NOTE — Progress Notes (Signed)
Post Partum Day #2 Subjective: no complaints and up ad lib; bottlefeeding; considering Mirena but concerned re irregular bldg initially  Objective: Blood pressure 101/67, pulse 52, temperature 98.1 F (36.7 C), temperature source Oral, resp. rate 18, height 5\' 11"  (1.803 m), weight 108.863 kg (240 lb), last menstrual period 09/15/2010, SpO2 99.00%, unknown if currently breastfeeding.  Physical Exam:  General: alert, cooperative and no distress Lochia: appropriate Uterine Fundus: firm    Basename 06/04/11 2024  HGB 14.0  HCT 40.3    Assessment/Plan: Discharge home  Rx Motrin F/U at [facility] in 4-6 wks   LOS: 3 days   Francille Wittmann 06/07/2011, 7:30 AM

## 2012-05-24 ENCOUNTER — Encounter: Payer: Medicaid Other | Admitting: Family Medicine

## 2012-05-24 ENCOUNTER — Encounter: Payer: Self-pay | Admitting: Family Medicine

## 2012-12-07 ENCOUNTER — Emergency Department (HOSPITAL_COMMUNITY)
Admission: EM | Admit: 2012-12-07 | Discharge: 2012-12-07 | Disposition: A | Discharge status: Home / Self Care | Payer: No Typology Code available for payment source | Attending: Emergency Medicine | Admitting: Emergency Medicine

## 2012-12-07 ENCOUNTER — Encounter (HOSPITAL_COMMUNITY): Payer: Self-pay | Admitting: *Deleted

## 2012-12-07 DIAGNOSIS — Y9389 Activity, other specified: Secondary | ICD-10-CM | POA: Insufficient documentation

## 2012-12-07 DIAGNOSIS — Z8619 Personal history of other infectious and parasitic diseases: Secondary | ICD-10-CM | POA: Insufficient documentation

## 2012-12-07 DIAGNOSIS — F17200 Nicotine dependence, unspecified, uncomplicated: Secondary | ICD-10-CM | POA: Insufficient documentation

## 2012-12-07 DIAGNOSIS — S46909A Unspecified injury of unspecified muscle, fascia and tendon at shoulder and upper arm level, unspecified arm, initial encounter: Secondary | ICD-10-CM | POA: Insufficient documentation

## 2012-12-07 DIAGNOSIS — S4980XA Other specified injuries of shoulder and upper arm, unspecified arm, initial encounter: Secondary | ICD-10-CM | POA: Insufficient documentation

## 2012-12-07 DIAGNOSIS — S139XXA Sprain of joints and ligaments of unspecified parts of neck, initial encounter: Secondary | ICD-10-CM | POA: Insufficient documentation

## 2012-12-07 DIAGNOSIS — Y92410 Unspecified street and highway as the place of occurrence of the external cause: Secondary | ICD-10-CM | POA: Insufficient documentation

## 2012-12-07 DIAGNOSIS — Z8659 Personal history of other mental and behavioral disorders: Secondary | ICD-10-CM | POA: Insufficient documentation

## 2012-12-07 MED ORDER — HYDROCODONE-ACETAMINOPHEN 5-325 MG PO TABS: 1.0000 | ORAL_TABLET | Freq: Four times a day (QID) | ORAL | Status: DC | PRN

## 2012-12-07 MED ORDER — CYCLOBENZAPRINE HCL 10 MG PO TABS: 10.0000 mg | ORAL_TABLET | Freq: Two times a day (BID) | ORAL | Status: DC | PRN

## 2012-12-07 MED ORDER — IBUPROFEN 600 MG PO TABS: 600.0000 mg | ORAL_TABLET | Freq: Four times a day (QID) | ORAL | Status: DC | PRN

## 2012-12-07 NOTE — ED Notes (Signed)
The pt has some lt neck pain also to the lt side of her neck
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
## 2012-12-09 NOTE — ED Provider Notes (Signed)
Medical screening examination/treatment/procedure(s) were performed by non-physician practitioner and as supervising physician I was immediately available for consultation/collaboration.   Alessander Sikorski M Lovett Coffin, MD 12/09/12 0044 

## 2014-03-03 ENCOUNTER — Encounter (HOSPITAL_COMMUNITY): Payer: Self-pay | Admitting: *Deleted

## 2017-11-06 ENCOUNTER — Other Ambulatory Visit: Payer: Self-pay

## 2017-11-06 ENCOUNTER — Inpatient Hospital Stay (HOSPITAL_COMMUNITY)
Admission: AD | Admit: 2017-11-06 | Discharge: 2017-11-06 | Discharge status: Against Medical Advice | Payer: Self-pay | Source: Ambulatory Visit | Attending: Obstetrics and Gynecology | Admitting: Obstetrics and Gynecology

## 2017-11-06 ENCOUNTER — Encounter (HOSPITAL_COMMUNITY): Payer: Self-pay

## 2017-11-06 DIAGNOSIS — N898 Other specified noninflammatory disorders of vagina: Secondary | ICD-10-CM | POA: Insufficient documentation

## 2017-11-06 DIAGNOSIS — O26891 Other specified pregnancy related conditions, first trimester: Secondary | ICD-10-CM | POA: Insufficient documentation

## 2017-11-06 DIAGNOSIS — Z3A01 Less than 8 weeks gestation of pregnancy: Secondary | ICD-10-CM | POA: Insufficient documentation

## 2017-11-06 DIAGNOSIS — Z5320 Procedure and treatment not carried out because of patient's decision for unspecified reasons: Secondary | ICD-10-CM

## 2017-11-06 DIAGNOSIS — O99331 Smoking (tobacco) complicating pregnancy, first trimester: Secondary | ICD-10-CM | POA: Insufficient documentation

## 2017-11-06 DIAGNOSIS — Z5329 Procedure and treatment not carried out because of patient's decision for other reasons: Secondary | ICD-10-CM

## 2017-11-06 DIAGNOSIS — Z3201 Encounter for pregnancy test, result positive: Secondary | ICD-10-CM

## 2017-11-06 DIAGNOSIS — R109 Unspecified abdominal pain: Secondary | ICD-10-CM

## 2017-11-06 DIAGNOSIS — Z5321 Procedure and treatment not carried out due to patient leaving prior to being seen by health care provider: Secondary | ICD-10-CM | POA: Insufficient documentation

## 2017-11-06 DIAGNOSIS — F17210 Nicotine dependence, cigarettes, uncomplicated: Secondary | ICD-10-CM | POA: Insufficient documentation

## 2017-11-06 DIAGNOSIS — R1030 Lower abdominal pain, unspecified: Secondary | ICD-10-CM | POA: Insufficient documentation

## 2017-11-06 LAB — URINALYSIS, ROUTINE W REFLEX MICROSCOPIC
BILIRUBIN URINE: NEGATIVE
GLUCOSE, UA: NEGATIVE mg/dL
HGB URINE DIPSTICK: NEGATIVE
KETONES UR: NEGATIVE mg/dL
NITRITE: POSITIVE — AB
Protein, ur: NEGATIVE mg/dL
Specific Gravity, Urine: 1.02 (ref 1.005–1.030)
WBC, UA: >50 WBC/hpf — ABNORMAL HIGH (ref 0–5)
pH: 6 (ref 5.0–8.0)

## 2017-11-06 LAB — POCT PREGNANCY, URINE: Preg Test, Ur: POSITIVE — AB

## 2017-11-06 NOTE — MAU Provider Note (Signed)
History     CSN: [id number]  Arrival date and time: 11/06/17 0028   First Provider Initiated Contact with Patient 11/06/17 0123      Chief Complaint  Patient presents with  . Emesis  . Headache  . Vaginal Discharge    requesting STD check    HPI  Makayla Kramer is a 27 y.o. G2P1001 at 5239w0d by LMP who presents with vaginal discharge, abdominal pain, and nausea. Had taken a pregnancy test at home but her LMP was in May and she was unsure if she was pregnant. No contraception.  Reports vaginal discharge x 3 days. Discharge is yellow/white. No odor or irritation. Wants STD testing. Has been with the same partner x 4 years. He had gonorrhea last year but at that time her testing was negative.  Has had lower abdominal pain x 2 days. Pain is intermittent cramping. States its feels like her period is about to start. Denies vaginal bleeding.   OB History    Gravida  2   Para  1   Term  1   Preterm      AB      Living  1     SAB      TAB      Ectopic      Multiple      Live Births  1           Past Medical History:  Diagnosis Date  . Gonorrhea   . 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 Mental disorder   . Syphilis   . Trichomonas     Past Surgical History:  Procedure Laterality Date  . TOOTH EXTRACTION      Family History  Problem Relation Age of Onset  . Diabetes Maternal Grandmother     Social History   Tobacco Use  . Smoking status: Current Every Day Smoker    Packs/day: 0.25    Years: 3.00    Pack years: 0.75    Types: Cigarettes  . Smokeless tobacco: Never Used  Substance Use Topics  . Alcohol use: Yes  . Drug use: Yes    Types: Marijuana    Comment: last used 2-3 days ago    Allergies: No Known Allergies  Medications Prior to Admission  Medication Sig Dispense Refill Last Dose  . Acetaminophen-Caff-Pyrilamine (MIDOL COMPLETE PO) Take 1 tablet by mouth daily as needed. For pain   More than a month at Unknown time  .  cyclobenzaprine (FLEXERIL) 10 MG tablet Take 1 tablet (10 mg total) by mouth 2 (two) times daily as needed for muscle spasms. 20 tablet 0   . HYDROcodone-acetaminophen (NORCO/VICODIN) 5-325 MG per tablet Take 1 tablet by mouth every 6 (six) hours as needed for pain. 10 tablet 0   . ibuprofen (ADVIL,MOTRIN) 600 MG tablet Take 1 tablet (600 mg total) by mouth every 6 (six) hours as needed for pain. 12 tablet 0 More than a month at Unknown time    Review of Systems  Constitutional: Negative.   Gastrointestinal: Positive for abdominal pain and nausea. Negative for vomiting.  Genitourinary: Positive for vaginal discharge. Negative for dysuria and vaginal bleeding.   Physical Exam   Blood pressure 112/64, pulse 86, temperature 98 F (36.7 C), temperature source Oral, resp. rate 17, height 5\' 11"  (1.803 m), weight 252 lb 0.6 oz (114.3 kg), last menstrual period 09/25/2017.  Physical Exam  Nursing note and vitals reviewed. Constitutional: She is oriented  to person, place, and time. She appears well-developed and well-nourished. No distress.  HENT:  Head: Normocephalic and atraumatic.  Eyes: Conjunctivae are normal. Right eye exhibits no discharge. Left eye exhibits no discharge. No scleral icterus.  Neck: Normal range of motion.  Respiratory: Effort normal. No respiratory distress.  Neurological: She is alert and oriented to person, place, and time.  Skin: Skin is warm and dry. She is not diaphoretic.  Psychiatric: She has a normal mood and affect. Her behavior is normal. Judgment and thought content normal.    MAU Course  Procedures Results for orders placed or performed during the hospital encounter of 11/06/17 (from the past 24 hour(s))  Urinalysis, Routine w reflex microscopic     Status: Abnormal   Collection Time: 11/06/17 12:53 AM  Result Value Ref Range   Color, Urine YELLOW YELLOW   APPearance HAZY (A) CLEAR   Specific Gravity, Urine 1.020 1.005 - 1.030   pH 6.0 5.0 - 8.0    Glucose, UA NEGATIVE NEGATIVE mg/dL   Hgb urine dipstick NEGATIVE NEGATIVE   Bilirubin Urine NEGATIVE NEGATIVE   Ketones, ur NEGATIVE NEGATIVE mg/dL   Protein, ur NEGATIVE NEGATIVE mg/dL   Nitrite POSITIVE (A) NEGATIVE   Leukocytes, UA MODERATE (A) NEGATIVE   RBC / HPF 0-5 0 - 5 RBC/hpf   WBC, UA >50 (H) 0 - 5 WBC/hpf   Bacteria, UA MANY (A) NONE SEEN   Squamous Epithelial / LPF 0-5 0 - 5   Mucus PRESENT   Pregnancy, urine POC     Status: Abnormal   Collection Time: 11/06/17  1:00 AM  Result Value Ref Range   Preg Test, Ur POSITIVE (A) NEGATIVE    MDM Discussed plan for labs, pelvic exam, and ultrasound. Pt initially agreeable with plan. When phlebotomy entered room, she declines blood work.  She states she feels like she's ok to wait until tomorrow and would rather f/u with her PCP.  Discussed with patient that though she appears stable, I can't exclude life threatening issues such as ectopic pregnancy. Patient opts to sign out AMA.   Assessment and Plan  A: 1. Positive pregnancy test   2. Abdominal pain during pregnancy in first trimester   3. Left against medical advice    P: Pt signed out AMA  Judeth Horn 11/06/2017, 1:23 AM

## 2017-11-06 NOTE — MAU Note (Signed)
Pt presents to MAU c/o a yellowish whitish vaginal discharge x1day. Pt also reports a HA and vomiting that started this morning. Pt states she has vomited 4x today. Pt is unsure if she is pregnant. LMP was May 26th 2019. Pt has not taken any home pregnancy test.

## 2018-07-16 ENCOUNTER — Other Ambulatory Visit: Payer: Self-pay

## 2018-07-16 ENCOUNTER — Ambulatory Visit (HOSPITAL_COMMUNITY)
Admission: EM | Admit: 2018-07-16 | Discharge: 2018-07-16 | Disposition: A | Discharge status: Home / Self Care | Payer: No Typology Code available for payment source | Attending: Physician Assistant | Admitting: Physician Assistant

## 2018-07-16 ENCOUNTER — Encounter (HOSPITAL_COMMUNITY): Payer: Self-pay

## 2018-07-16 DIAGNOSIS — F17210 Nicotine dependence, cigarettes, uncomplicated: Secondary | ICD-10-CM | POA: Diagnosis not present

## 2018-07-16 DIAGNOSIS — F99 Mental disorder, not otherwise specified: Secondary | ICD-10-CM | POA: Insufficient documentation

## 2018-07-16 DIAGNOSIS — L089 Local infection of the skin and subcutaneous tissue, unspecified: Secondary | ICD-10-CM | POA: Diagnosis not present

## 2018-07-16 DIAGNOSIS — M545 Low back pain: Secondary | ICD-10-CM | POA: Insufficient documentation

## 2018-07-16 DIAGNOSIS — Z113 Encounter for screening for infections with a predominantly sexual mode of transmission: Secondary | ICD-10-CM

## 2018-07-16 DIAGNOSIS — M542 Cervicalgia: Secondary | ICD-10-CM | POA: Diagnosis not present

## 2018-07-16 DIAGNOSIS — Z202 Contact with and (suspected) exposure to infections with a predominantly sexual mode of transmission: Secondary | ICD-10-CM

## 2018-07-16 MED ORDER — IBUPROFEN 800 MG PO TABS: 800.0000 mg | ORAL_TABLET | Freq: Three times a day (TID) | ORAL | 0 refills | Status: DC

## 2018-07-16 MED ORDER — METHOCARBAMOL 500 MG PO TABS: 500.0000 mg | ORAL_TABLET | Freq: Two times a day (BID) | ORAL | 0 refills | Status: DC
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
## 2018-07-16 NOTE — Discharge Instructions (Signed)
Return if any problems.  You have test pending 

## 2018-07-17 LAB — CERVICOVAGINAL ANCILLARY ONLY
Bacterial vaginitis: POSITIVE — AB
CHLAMYDIA, DNA PROBE: POSITIVE — AB
Candida vaginitis: NEGATIVE
NEISSERIA GONORRHEA: POSITIVE — AB
TRICH (WINDOWPATH): NEGATIVE

## 2018-07-17 LAB — RPR: RPR Ser Ql: NONREACTIVE

## 2018-07-17 LAB — HIV ANTIBODY (ROUTINE TESTING W REFLEX): HIV Screen 4th Generation wRfx: NONREACTIVE

## 2018-07-19 ENCOUNTER — Telehealth (HOSPITAL_COMMUNITY): Payer: Self-pay | Admitting: Emergency Medicine

## 2018-07-19 MED ORDER — METRONIDAZOLE 500 MG PO TABS: 500.0000 mg | ORAL_TABLET | Freq: Two times a day (BID) | ORAL | 0 refills | Status: AC

## 2018-07-19 NOTE — Telephone Encounter (Signed)
Bacterial vaginosis is positive. This was not treated at the urgent care visit.  Flagyl 500 mg BID x 7 days #14 no refills sent to patients pharmacy of choice.    Chlamydia is positive.  Patient NEEDS to return for treatment.  Pt needs education to please refrain from sexual intercourse for 7 days to give the medicine time to work, sexual partners need to be notified and tested/treated.  Condoms may reduce risk of reinfection.  Recheck or followup with PCP for further evaluation if symptoms are not improving.   [facility] notified.  Gonorrhea is positive.  Patient should return as soon as possible to the urgent care for treatment with IM rocephin 250mg  and po zithromax 1g. Patient will not need to see a provider unless there are new symptoms she would like evaluated. Pt needs education to refrain from sexual intercourse for now and for 7 days after treatment to give the medicine time to work. Sexual partners need to be notified and tested/treated. Condoms may reduce risk of reinfection. [facility] notified.   Attempted to reach patient. No answer at this time. Voicemail left.

## 2018-07-19 NOTE — Telephone Encounter (Signed)
Patient contacted and made aware of all results, all questions answered.  Patient will return tomorrow for treatment.

## 2018-07-20 ENCOUNTER — Ambulatory Visit (HOSPITAL_COMMUNITY)
Admission: EM | Admit: 2018-07-20 | Discharge: 2018-07-20 | Disposition: A | Discharge status: Home / Self Care | Payer: Self-pay | Attending: Family Medicine | Admitting: Family Medicine

## 2018-07-20 DIAGNOSIS — A5429 Other gonococcal genitourinary infections: Secondary | ICD-10-CM

## 2018-07-20 DIAGNOSIS — A749 Chlamydial infection, unspecified: Secondary | ICD-10-CM

## 2018-07-20 MED ORDER — LIDOCAINE HCL 2 % IJ SOLN
INTRAMUSCULAR | Status: AC
  Filled 2018-07-20: qty 20

## 2018-07-20 MED ORDER — CEFTRIAXONE SODIUM 250 MG IJ SOLR
INTRAMUSCULAR | Status: AC
  Filled 2018-07-20: qty 250

## 2018-07-20 MED ORDER — AZITHROMYCIN 250 MG PO TABS
1000.0000 mg | ORAL_TABLET | Freq: Once | ORAL | Status: AC
  Administered 2018-07-20: 1000 mg via ORAL

## 2018-07-20 MED ORDER — CEFTRIAXONE SODIUM 250 MG IJ SOLR
250.0000 mg | Freq: Once | INTRAMUSCULAR | Status: AC
  Administered 2018-07-20: 250 mg via INTRAMUSCULAR

## 2018-07-20 MED ORDER — AZITHROMYCIN 250 MG PO TABS
ORAL_TABLET | ORAL | Status: AC
  Filled 2018-07-20: qty 4

## 2018-07-20 NOTE — ED Triage Notes (Signed)
Pt presents for STD Treatment.  NURSE VISIT

## 2019-02-18 ENCOUNTER — Telehealth: Payer: Self-pay | Admitting: Family Medicine

## 2019-02-18 ENCOUNTER — Telehealth: Payer: Self-pay | Admitting: *Deleted

## 2019-02-18 NOTE — Telephone Encounter (Signed)
Makayla Kramer called and left another message about scheduling a whole body physical because she thinks she is pregnant. I called Makayla Kramer back and asked if she has had a positive home pregnancy test . She states she has not. I asked if she has had her pregnancy confirmed in another office. She states she has not. I explained she would need to have a nurse visit first to confirm pregnancy and then we would schedule a new ob visit . I also explained she would have a pregnancy exam including pelvic exam, pap, breast check not complete physical.  I also explained if she is not pregnant we can schedule her for gyn annual exam not complete physical that PCP would do. I informed her she can come to drop in nurse visit on Monday evenings or schedule nurse visit during the day. She states she will come to an evening drop in on next Monday. I explained if she can't make that she can call and schedule nurse visit for pregnancy test. She voices understanding.  Linda,RN

## 2019-02-18 NOTE — Telephone Encounter (Signed)
Makayla Kramer called and left a message she wants to see if she can get a physical and that she believes she is pregnant. Linda,RN

## 2019-02-18 NOTE — Telephone Encounter (Signed)
Attempted to contact patient to get her scheduled for a preg test per inbasket message from [place]. No answer, unable to leave a voicemail due to the mailbox being full.

## 2019-11-09 ENCOUNTER — Other Ambulatory Visit: Payer: Self-pay

## 2019-11-09 ENCOUNTER — Encounter (HOSPITAL_COMMUNITY): Payer: Self-pay | Admitting: Emergency Medicine

## 2019-11-09 ENCOUNTER — Emergency Department (HOSPITAL_COMMUNITY): Payer: Self-pay

## 2019-11-09 ENCOUNTER — Encounter (HOSPITAL_COMMUNITY): Payer: Self-pay

## 2019-11-09 ENCOUNTER — Emergency Department (HOSPITAL_COMMUNITY)
Admission: EM | Admit: 2019-11-09 | Discharge: 2019-11-09 | Disposition: A | Discharge status: Home / Self Care | Payer: Self-pay | Attending: Emergency Medicine | Admitting: Emergency Medicine

## 2019-11-09 ENCOUNTER — Emergency Department (HOSPITAL_COMMUNITY)
Admission: EM | Admit: 2019-11-09 | Discharge: 2019-11-10 | Disposition: A | Discharge status: Home / Self Care | Payer: Self-pay | Attending: Emergency Medicine | Admitting: Emergency Medicine

## 2019-11-09 DIAGNOSIS — R079 Chest pain, unspecified: Secondary | ICD-10-CM

## 2019-11-09 DIAGNOSIS — F1290 Cannabis use, unspecified, uncomplicated: Secondary | ICD-10-CM | POA: Insufficient documentation

## 2019-11-09 DIAGNOSIS — R1110 Vomiting, unspecified: Secondary | ICD-10-CM | POA: Insufficient documentation

## 2019-11-09 DIAGNOSIS — F17210 Nicotine dependence, cigarettes, uncomplicated: Secondary | ICD-10-CM | POA: Insufficient documentation

## 2019-11-09 DIAGNOSIS — R109 Unspecified abdominal pain: Secondary | ICD-10-CM | POA: Insufficient documentation

## 2019-11-09 DIAGNOSIS — R112 Nausea with vomiting, unspecified: Secondary | ICD-10-CM | POA: Insufficient documentation

## 2019-11-09 DIAGNOSIS — R0789 Other chest pain: Secondary | ICD-10-CM | POA: Insufficient documentation

## 2019-11-09 DIAGNOSIS — R197 Diarrhea, unspecified: Secondary | ICD-10-CM | POA: Insufficient documentation

## 2019-11-09 DIAGNOSIS — R001 Bradycardia, unspecified: Secondary | ICD-10-CM | POA: Insufficient documentation

## 2019-11-09 DIAGNOSIS — Z5321 Procedure and treatment not carried out due to patient leaving prior to being seen by health care provider: Secondary | ICD-10-CM | POA: Insufficient documentation

## 2019-11-09 LAB — CBC
HCT: 48.4 % — ABNORMAL HIGH (ref 36.0–46.0)
Hemoglobin: 15.9 g/dL — ABNORMAL HIGH (ref 12.0–15.0)
MCH: 31.7 pg (ref 26.0–34.0)
MCHC: 32.9 g/dL (ref 30.0–36.0)
MCV: 96.4 fL (ref 80.0–100.0)
Platelets: 329 10*3/uL (ref 150–400)
RBC: 5.02 MIL/uL (ref 3.87–5.11)
RDW: 13.1 % (ref 11.5–15.5)
WBC: 6.8 10*3/uL (ref 4.0–10.5)
nRBC: 0 % (ref 0.0–0.2)

## 2019-11-09 LAB — BASIC METABOLIC PANEL
Anion gap: 16 — ABNORMAL HIGH (ref 5–15)
BUN: 13 mg/dL (ref 6–20)
CO2: 21 mmol/L — ABNORMAL LOW (ref 22–32)
Calcium: 10 mg/dL (ref 8.9–10.3)
Chloride: 107 mmol/L (ref 98–111)
Creatinine, Ser: 0.98 mg/dL (ref 0.44–1.00)
GFR calc Af Amer: >60 mL/min (ref >60)
GFR calc non Af Amer: >60 mL/min (ref >60)
Glucose, Bld: 112 mg/dL — ABNORMAL HIGH (ref 70–99)
Potassium: 3.9 mmol/L (ref 3.5–5.1)
Sodium: 144 mmol/L (ref 135–145)

## 2019-11-09 LAB — LIPASE, BLOOD: Lipase: 22 U/L (ref 11–51)

## 2019-11-09 LAB — I-STAT BETA HCG BLOOD, ED (MC, WL, AP ONLY): I-stat hCG, quantitative: <5 m[IU]/mL (ref <5)

## 2019-11-09 LAB — TROPONIN I (HIGH SENSITIVITY): Troponin I (High Sensitivity): 4 ng/L (ref <18)

## 2019-11-09 MED ORDER — SODIUM CHLORIDE 0.9% FLUSH: 3.0000 mL | Freq: Once | INTRAVENOUS | Status: DC

## 2019-11-09 MED ORDER — ONDANSETRON 4 MG PO TBDP
4.0000 mg | ORAL_TABLET | Freq: Once | ORAL | Status: AC | PRN
  Administered 2019-11-09: 4 mg via ORAL
  Filled 2019-11-09: qty 1

## 2019-11-09 NOTE — ED Provider Notes (Signed)
[facility]-EMERGENCY DEPT Provider Note   CSN: [id number] Arrival date & time: 11/09/19  2249     History Chief Complaint  Patient presents with  . Emesis    Makayla Kramer is a 29 y.o. female with a history of bipolar disorder, prior STIs, and chronic pelvic pain who presents to the ED with complaints of N/V since yesterday. Patient reports she has had too numerous to count episodes of emesis, she is unable to keep anything down. With this she has had a couple episodes of loose stools as well as constant chest/back/upper abdominal discomfort described as aching, worse with vomiting, no alleviating factors. Given ODT zofran [facility] without relief. Denies fever, hematemesis, melena, hematochezia, dysuria, vaginal bleeding/discharge, dysuria, dyspnea, or syncope.  She smokes marijuana daily, most recently smoked 2 days prior.  Denies recent foreign travel or antibiotics.  HPI     Past Medical History:  Diagnosis Date  . Gonorrhea   . 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 Mental disorder   . Syphilis   . Trichomonas     Patient Active Problem List   Diagnosis Date Noted  . PELVIC PAIN, CHRONIC 05/15/2008  . HERPES LABIALIS 04/10/2007  . Condyloma acuminatum 06/29/2006  . OBESITY, NOS 06/29/2006  . BIPOLAR DISORDER 06/29/2006    Past Surgical History:  Procedure Laterality Date  . TOOTH EXTRACTION       OB History    Gravida  2   Para  1   Term  1   Preterm      AB      Living  1     SAB      TAB      Ectopic      Multiple      Live Births  1           Family History  Problem Relation Age of Onset  . Diabetes Maternal Grandmother     Social History   Tobacco Use  . Smoking status: Current Every Day Smoker    Packs/day: 0.25    Years: 3.00    Pack years: 0.75    Types: Cigarettes  . Smokeless tobacco: Never Used  Substance Use Topics  . Alcohol use: Yes  . Drug use: Yes    Types:  Marijuana    Comment: last used 2-3 days ago    Home Medications Prior to Admission medications   Medication Sig Start Date End Date Taking? Authorizing Provider  Acetaminophen-Caff-Pyrilamine (MIDOL COMPLETE PO) Take 1 tablet by mouth daily as needed. For pain    [provider]  cyclobenzaprine (FLEXERIL) 10 MG tablet Take 1 tablet (10 mg total) by mouth 2 (two) times daily as needed for muscle spasms. 12/07/12   Felicie Morn, NP  HYDROcodone-acetaminophen (NORCO/VICODIN) 5-325 MG per tablet Take 1 tablet by mouth every 6 (six) hours as needed for pain. 12/07/12   Felicie Morn, NP  ibuprofen (ADVIL,MOTRIN) 800 MG tablet Take 1 tablet (800 mg total) by mouth 3 (three) times daily. 07/16/18   Elson Areas, PA-C  methocarbamol (ROBAXIN) 500 MG tablet Take 1 tablet (500 mg total) by mouth 2 (two) times daily. 07/16/18   Elson Areas, PA-C    Allergies    Patient has no known allergies.  Review of Systems   Review of Systems  Constitutional: Negative for fever.  Respiratory: Negative for cough and shortness of breath.   Cardiovascular: Positive for  chest pain.  Gastrointestinal: Positive for abdominal pain, diarrhea, nausea and vomiting. Negative for anal bleeding, blood in stool and constipation.  Genitourinary: Negative for dysuria, vaginal bleeding and vaginal discharge.  Neurological: Negative for syncope.  All other systems reviewed and are negative.   Physical Exam Updated Vital Signs BP 137/86 (BP Location: Left Arm)   Pulse (!) 48   Temp 98.7 F (37.1 C) (Oral)   Resp 16   SpO2 100%   Physical Exam Vitals and nursing note reviewed.  Constitutional:      General: She is not in acute distress.    Appearance: She is well-developed. She is not toxic-appearing.  HENT:     Head: Normocephalic and atraumatic.     Mouth/Throat:     Mouth: Mucous membranes are dry.  Eyes:     General:        Right eye: No discharge.        Left eye: No discharge.      Conjunctiva/sclera: Conjunctivae normal.  Cardiovascular:     Rate and Rhythm: Regular rhythm. Bradycardia present.  Pulmonary:     Effort: Pulmonary effort is normal. No respiratory distress.     Breath sounds: Normal breath sounds. No wheezing, rhonchi or rales.  Chest:     Chest wall: Tenderness (Central anterior chest wall.) present.  Abdominal:     General: There is no distension.     Palpations: Abdomen is soft.     Tenderness: There is no abdominal tenderness. There is no right CVA tenderness, left CVA tenderness, guarding or rebound.  Musculoskeletal:     Cervical back: Neck supple.  Skin:    General: Skin is warm and dry.     Findings: No rash.  Neurological:     Mental Status: She is alert.     Comments: Clear speech.   Psychiatric:        Behavior: Behavior normal.     ED Results / Procedures / Treatments   Labs (all labs ordered are listed, but only abnormal results are displayed) Labs Reviewed  HEPATIC FUNCTION PANEL  URINALYSIS, ROUTINE W REFLEX MICROSCOPIC    Results for orders placed or performed during the hospital encounter of 11/09/19  Basic metabolic panel  Result Value Ref Range   Sodium 144 135 - 145 mmol/L   Potassium 3.9 3.5 - 5.1 mmol/L   Chloride 107 98 - 111 mmol/L   CO2 21 (L) 22 - 32 mmol/L   Glucose, Bld 112 (H) 70 - 99 mg/dL   BUN 13 6 - 20 mg/dL   Creatinine, Ser 9.83 0.44 - 1.00 mg/dL   Calcium 38.2 8.9 - 50.5 mg/dL   GFR calc non Af Amer >60 >60 mL/min   GFR calc Af Amer >60 >60 mL/min   Anion gap 16 (H) 5 - 15  CBC  Result Value Ref Range   WBC 6.8 4.0 - 10.5 K/uL   RBC 5.02 3.87 - 5.11 MIL/uL   Hemoglobin 15.9 (H) 12.0 - 15.0 g/dL   HCT 39.7 (H) 36 - 46 %   MCV 96.4 80.0 - 100.0 fL   MCH 31.7 26.0 - 34.0 pg   MCHC 32.9 30.0 - 36.0 g/dL   RDW 67.3 41.9 - 37.9 %   Platelets 329 150 - 400 K/uL   nRBC 0.0 0.0 - 0.2 %  Lipase, blood  Result Value Ref Range   Lipase 22 11 - 51 U/L  I-Stat beta hCG blood, ED  Result Value Ref  Range   I-stat hCG, quantitative <5.0 <5 mIU/mL   Comment 3          Troponin I (High Sensitivity)  Result Value Ref Range   Troponin I (High Sensitivity) 4 <18 ng/L    EKG EKG Interpretation  Date/Time:  Saturday November 09 2019 23:00:40 EDT Ventricular Rate:  52 PR Interval:    QRS Duration: 95 QT Interval:  427 QTC Calculation: 398 R Axis:   66 Text Interpretation: Sinus rhythm Short PR interval 12 Lead; Mason-Likar No significant change since last tracing Confirmed by Melene PlanFloyd, Dan [phone]) on 11/09/2019 11:05:28 PM   Radiology DG Chest 2 View  Result Date: 11/09/2019 CLINICAL DATA:  Chest pain and shortness of breath for 2 days, smoker, no fever, believe she has COVID-19 EXAM: CHEST - 2 VIEW COMPARISON:  None. FINDINGS: The heart size and mediastinal contours are within normal limits. Both lungs are clear. The visualized skeletal structures are unremarkable. IMPRESSION: No active cardiopulmonary disease. Electronically Signed   By: Kreg ShropshirePrice  DeHay M.D.   On: 11/09/2019 21:51    Procedures Procedures (including critical care time)  Medications Ordered in ED Medications - No data to display  ED Course  I have reviewed the triage vital signs and the nursing notes.  Pertinent labs & imaging results that were available during my care of the patient were reviewed by me and considered in my medical decision making (see chart for details).    MDM Rules/Calculators/A&P                         Patient presents to the ED with complaints of N/V/D with chest/abdominal discomfort since yesterday. Nontoxic, vitals w/ mild bradycardia otherwise WNL. On exam her chest pain is reproducible with chest wall palpation. No abdominal tenderness or peritoneal signs. Dry MM noted.   Additional history obtained:  Additional history obtained from chart & nursing note review. Previous records obtained and reviewed.   EKG: Sinus rhythm Short PR interval 12 Lead; Mason-Likar No significant change since  last tracing Lab Tests:  [facility], this has been reviewed: CBC: No leukocytosis.  Mildly elevated hemoglobin/hematocrit. BMP: Mild anion gap acidosis with bicarb of 21 and gap of 16.  Otherwise unremarkable. Lipase: Within normal limits Troponin: Within normal limits Pregnancy test: Negative  Added on hepatic function panel & UA.  UA: no UTI, hematuria.  Hepatic function panel- unable to obtain.   Imaging Studies:  Chest x-ray was performed at outside facility I independently visualized and interpreted imaging which showed No active cardiopulmonary disease. ED Course:  Given marijuana use and no relief S/p zofran will trial haldol. Fluids ordered.   Patient feeling much better on re-assessment in the ED. No further vomiting S/p haldol. She is tolerating PO. Repeat abdominal exam remains without peritoneal signs, doubt appendicitis, cholecystitis, pancreatitis, colitis, perf, obstruction or acute surgical process. Unfortunately could not obtain additional blood for hepatic function panel however no RUQ abdominal tenderness, negative murphys which is reassuring. Chest pain reproducible w/ palpation. CXR w/o acute process. EKG without ischemic changes, troponin negative after > 6 hours of continuous pain- doubt acs. Low risk wells- doubt PE. Sxs possibly viral vs. Marijuana induced. Will discharge home with supportive care at this time. I discussed results, treatment plan, need for follow-up, and return precautions with the patient. Provided opportunity for questions, patient confirmed understanding and is in  agreement with plan.   Portions of this note were generated with Scientist, clinical (histocompatibility and immunogenetics). Dictation errors may occur despite best attempts at proofreading.  Final Clinical Impression(s) / ED Diagnoses Final diagnoses:  Nausea vomiting and diarrhea  Chest pain, unspecified type     Rx / DC Orders ED Discharge Orders         Ordered    ondansetron (ZOFRAN ODT) 4 MG disintegrating tablet  Every 8 hours PRN     Discontinue  Reprint     11/10/19 0501    famotidine (PEPCID) 20 MG tablet  2 times daily PRN     Discontinue  Reprint     11/10/19 0501           Leviathan Macera, Pleas Koch, PA-C 11/10/19 0502    Melene Plan, DO 11/10/19 [phone]

## 2019-11-09 NOTE — ED Notes (Signed)
Pt called for VS, no response.  °

## 2019-11-09 NOTE — ED Triage Notes (Signed)
Pt reports emesis X2 days, "chest hurts b/c I cough a lot."  fatiuge but no fevers.  It hurts when I breath.

## 2019-11-09 NOTE — ED Triage Notes (Signed)
Pt reports vomiting and diarrhea for 2 days. Also reports central and L sided chest pain. Denies cough. Endorses fatigue and chills. Blood work done at [facility] around 9p. Dr. Freida Busman aware.

## 2019-11-09 NOTE — ED Notes (Signed)
Pt going in and out of waiting room, has been called for xray x2. Pt informed that she needs to stay in the waiting area so that we can find her when she is called.

## 2019-11-10 LAB — URINALYSIS, ROUTINE W REFLEX MICROSCOPIC
Bacteria, UA: NONE SEEN
Bilirubin Urine: NEGATIVE
Glucose, UA: NEGATIVE mg/dL
Ketones, ur: 20 mg/dL — AB
Leukocytes,Ua: NEGATIVE
Nitrite: NEGATIVE
Protein, ur: 100 mg/dL — AB
Specific Gravity, Urine: 1.033 — ABNORMAL HIGH (ref 1.005–1.030)
pH: 6 (ref 5.0–8.0)

## 2019-11-10 MED ORDER — ONDANSETRON 4 MG PO TBDP
4.0000 mg | ORAL_TABLET | Freq: Three times a day (TID) | ORAL | 0 refills | Status: DC | PRN
Start: 2019-11-10 — End: 2019-11-18

## 2019-11-10 MED ORDER — HALOPERIDOL LACTATE 5 MG/ML IJ SOLN
2.0000 mg | Freq: Once | INTRAMUSCULAR | Status: AC
  Administered 2019-11-10: 2 mg via INTRAVENOUS
  Filled 2019-11-10: qty 1

## 2019-11-10 MED ORDER — ALUM & MAG HYDROXIDE-SIMETH 200-200-20 MG/5ML PO SUSP
30.0000 mL | Freq: Once | ORAL | Status: AC
  Administered 2019-11-10: 30 mL via ORAL
  Filled 2019-11-10: qty 30

## 2019-11-10 MED ORDER — SODIUM CHLORIDE 0.9 % IV BOLUS
1000.0000 mL | Freq: Once | INTRAVENOUS | Status: AC
  Administered 2019-11-10: 1000 mL via INTRAVENOUS

## 2019-11-10 MED ORDER — FAMOTIDINE 20 MG PO TABS
20.0000 mg | ORAL_TABLET | Freq: Two times a day (BID) | ORAL | 0 refills | Status: DC | PRN
Start: 2019-11-10 — End: 2021-07-07

## 2019-11-10 MED ORDER — FAMOTIDINE IN NACL 20-0.9 MG/50ML-% IV SOLN
20.0000 mg | Freq: Once | INTRAVENOUS | Status: AC
  Administered 2019-11-10: 20 mg via INTRAVENOUS
  Filled 2019-11-10: qty 50

## 2019-11-10 NOTE — ED Notes (Signed)
Pt ambulated to restroom for urine sample.

## 2019-11-10 NOTE — ED Notes (Signed)
Pt requesting something to drink so that she can attempt a urine sample.

## 2019-11-10 NOTE — Discharge Instructions (Addendum)
You were seen in the emergency department today for chest pain with nausea, vomiting, and diarrhea.  Your work-up was overall reassuring.  Your chest x-ray was normal.  Your labs were overall reassuring, they did show that you are bit dehydrated, you were given fluids in the ER.  Your pregnancy test was negative.  Your urine had some blood in it, please have this rechecked by your primary care provider.  Your EKG did not show signs of a heart attack.  We are sending home with Zofran to take every 8 hours as needed for nausea and vomiting and Pepcid to take twice per day as needed for stomach/chest discomfort.  We have prescribed you new medication(s) today. Discuss the medications prescribed today with your pharmacist as they can have adverse effects and interactions with your other medicines including over the counter and prescribed medications. Seek medical evaluation if you start to experience new or abnormal symptoms after taking one of these medicines, seek care immediately if you start to experience difficulty breathing, feeling of your throat closing, facial swelling, or rash as these could be indications of a more serious allergic reaction  Please try to stop utilizing marijuana as this may be contributing to your nausea and vomiting, this also may be related to a virus.  Please follow with your primary care provider within 3 days.  Return to the ER for new or worsening symptoms including but not limited to fever, inability to keep fluids down, increased pain, change in quality of pain, passing out, blood in vomit or stool, or any other concerns.

## 2019-11-10 NOTE — ED Notes (Signed)
Pt tolerated water without vomiting.

## 2019-11-18 ENCOUNTER — Emergency Department (HOSPITAL_COMMUNITY)
Admission: EM | Admit: 2019-11-18 | Discharge: 2019-11-18 | Disposition: A | Discharge status: Home / Self Care | Payer: Self-pay | Attending: Emergency Medicine | Admitting: Emergency Medicine

## 2019-11-18 ENCOUNTER — Emergency Department (HOSPITAL_COMMUNITY): Payer: Self-pay

## 2019-11-18 ENCOUNTER — Encounter (HOSPITAL_COMMUNITY): Payer: Self-pay

## 2019-11-18 ENCOUNTER — Other Ambulatory Visit: Payer: Self-pay

## 2019-11-18 DIAGNOSIS — R1110 Vomiting, unspecified: Secondary | ICD-10-CM

## 2019-11-18 DIAGNOSIS — F1490 Cocaine use, unspecified, uncomplicated: Secondary | ICD-10-CM | POA: Insufficient documentation

## 2019-11-18 DIAGNOSIS — R079 Chest pain, unspecified: Secondary | ICD-10-CM | POA: Insufficient documentation

## 2019-11-18 DIAGNOSIS — F1190 Opioid use, unspecified, uncomplicated: Secondary | ICD-10-CM

## 2019-11-18 DIAGNOSIS — F1110 Opioid abuse, uncomplicated: Secondary | ICD-10-CM | POA: Insufficient documentation

## 2019-11-18 DIAGNOSIS — R112 Nausea with vomiting, unspecified: Secondary | ICD-10-CM | POA: Insufficient documentation

## 2019-11-18 DIAGNOSIS — F17210 Nicotine dependence, cigarettes, uncomplicated: Secondary | ICD-10-CM | POA: Insufficient documentation

## 2019-11-18 LAB — BASIC METABOLIC PANEL
Anion gap: 18 — ABNORMAL HIGH (ref 5–15)
BUN: 15 mg/dL (ref 6–20)
CO2: 20 mmol/L — ABNORMAL LOW (ref 22–32)
Calcium: 10.3 mg/dL (ref 8.9–10.3)
Chloride: 98 mmol/L (ref 98–111)
Creatinine, Ser: 1.05 mg/dL — ABNORMAL HIGH (ref 0.44–1.00)
GFR calc Af Amer: >60 mL/min (ref >60)
GFR calc non Af Amer: >60 mL/min (ref >60)
Glucose, Bld: 92 mg/dL (ref 70–99)
Potassium: 3.5 mmol/L (ref 3.5–5.1)
Sodium: 136 mmol/L (ref 135–145)

## 2019-11-18 LAB — CBC
HCT: 50.8 % — ABNORMAL HIGH (ref 36.0–46.0)
Hemoglobin: 18 g/dL — ABNORMAL HIGH (ref 12.0–15.0)
MCH: 33.1 pg (ref 26.0–34.0)
MCHC: 35.4 g/dL (ref 30.0–36.0)
MCV: 93.4 fL (ref 80.0–100.0)
Platelets: 355 10*3/uL (ref 150–400)
RBC: 5.44 MIL/uL — ABNORMAL HIGH (ref 3.87–5.11)
RDW: 11.9 % (ref 11.5–15.5)
WBC: 6.6 10*3/uL (ref 4.0–10.5)
nRBC: 0 % (ref 0.0–0.2)

## 2019-11-18 LAB — I-STAT BETA HCG BLOOD, ED (NOT ORDERABLE): I-stat hCG, quantitative: <5 m[IU]/mL (ref <5)

## 2019-11-18 LAB — HEPATIC FUNCTION PANEL
ALT: 24 U/L (ref 0–44)
AST: 32 U/L (ref 15–41)
Albumin: 4.9 g/dL (ref 3.5–5.0)
Alkaline Phosphatase: 65 U/L (ref 38–126)
Bilirubin, Direct: 0.5 mg/dL — ABNORMAL HIGH (ref 0.0–0.2)
Indirect Bilirubin: 1.4 mg/dL — ABNORMAL HIGH (ref 0.3–0.9)
Total Bilirubin: 1.9 mg/dL — ABNORMAL HIGH (ref 0.3–1.2)
Total Protein: 8.4 g/dL — ABNORMAL HIGH (ref 6.5–8.1)

## 2019-11-18 LAB — TROPONIN I (HIGH SENSITIVITY): Troponin I (High Sensitivity): 3 ng/L (ref <18)

## 2019-11-18 LAB — LIPASE, BLOOD: Lipase: 25 U/L (ref 11–51)

## 2019-11-18 MED ORDER — ONDANSETRON 4 MG PO TBDP
4.0000 mg | ORAL_TABLET | Freq: Three times a day (TID) | ORAL | 0 refills | Status: DC | PRN
Start: 2019-11-18 — End: 2021-07-07

## 2019-11-18 MED ORDER — SODIUM CHLORIDE 0.9 % IV BOLUS
1000.0000 mL | Freq: Once | INTRAVENOUS | Status: AC
  Administered 2019-11-18: 1000 mL via INTRAVENOUS

## 2019-11-18 MED ORDER — ONDANSETRON 4 MG PO TBDP
4.0000 mg | ORAL_TABLET | Freq: Once | ORAL | Status: AC | PRN
  Administered 2019-11-18: 4 mg via ORAL
  Filled 2019-11-18: qty 1

## 2019-11-18 MED ORDER — ONDANSETRON HCL 4 MG/2ML IJ SOLN
4.0000 mg | Freq: Once | INTRAMUSCULAR | Status: AC
  Administered 2019-11-18: 4 mg via INTRAVENOUS
  Filled 2019-11-18: qty 2

## 2019-11-18 MED ORDER — KETOROLAC TROMETHAMINE 15 MG/ML IJ SOLN
15.0000 mg | Freq: Once | INTRAMUSCULAR | Status: AC
  Administered 2019-11-18: 15 mg via INTRAVENOUS
  Filled 2019-11-18: qty 1

## 2019-11-18 MED ORDER — SODIUM CHLORIDE 0.9% FLUSH
3.0000 mL | Freq: Once | INTRAVENOUS | Status: AC
  Administered 2019-11-18: 3 mL via INTRAVENOUS

## 2019-11-18 NOTE — Progress Notes (Signed)
Pt requesting ice chips, message received from ED provider pt can have ice chips. Updated pt's mother at bedside. Isidoro Donning RN CCM, [facility] ED TOC CM [phone]

## 2019-11-18 NOTE — ED Provider Notes (Signed)
[facility]   CSN: [id number] Arrival date & time: 11/18/19  [id number]     History Chief Complaint  Patient presents with  . Chest Pain  . Emesis    Makayla Kramer is a 29 y.o. female.   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 Gonorrhea   . 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 Mental disorder   . Syphilis   . Trichomonas     Patient Active Problem List   Diagnosis Date Noted  . PELVIC PAIN, CHRONIC 05/15/2008  . HERPES LABIALIS 04/10/2007  . Condyloma acuminatum 06/29/2006  . OBESITY, NOS 06/29/2006  . BIPOLAR DISORDER 06/29/2006    Past Surgical History:  Procedure Laterality Date  . TOOTH EXTRACTION       OB History    Gravida  2   Para  1   Term  1   Preterm      AB      Living  1     SAB      TAB      Ectopic      Multiple      Live Births  1           Family History  Problem Relation Age of Onset  . Diabetes Maternal Grandmother     Social History   Tobacco Use  . Smoking status: Current Every Day Smoker    Packs/day: 0.25    Years: 3.00    Pack years: 0.75    Types: Cigarettes  . Smokeless tobacco: Never Used  Vaping Use  . Vaping Use: Never used  Substance Use  Topics  . Alcohol use: Yes  . Drug use: Yes    Types: Marijuana    Comment: heroin, Roxy    Home Medications Prior to Admission medications   Medication Sig Start Date End Date Taking? Authorizing Provider  Acetaminophen-Caff-Pyrilamine (MIDOL COMPLETE PO) Take 1 tablet by mouth daily as needed (pain).    Yes [provider]  cyclobenzaprine (FLEXERIL) 10 MG tablet Take 1 tablet (10 mg total) by mouth 2 (two) times daily as needed for muscle spasms. Patient not taking: Reported on 11/18/2019 12/07/12   Felicie MornSmith, David, NP  famotidine (PEPCID) 20 MG tablet Take 1 tablet (20 mg total) by mouth 2 (two) times daily as needed for heartburn or indigestion. Patient not taking: Reported on 11/18/2019 11/10/19   Petrucelli, Pleas KochSamantha R, PA-C  HYDROcodone-acetaminophen (NORCO/VICODIN) 5-325 MG per tablet Take 1 tablet by mouth every 6 (six) hours as needed for pain. Patient not taking: Reported on 11/18/2019 12/07/12   Felicie MornSmith, David, NP  ibuprofen (ADVIL,MOTRIN) 800 MG tablet Take 1 tablet (800 mg total) by mouth 3 (three) times daily. Patient not taking: Reported on  11/18/2019 07/16/18   Elson Areas, PA-C  methocarbamol (ROBAXIN) 500 MG tablet Take 1 tablet (500 mg total) by mouth 2 (two) times daily. Patient not taking: Reported on 11/18/2019 07/16/18   Elson Areas, PA-C  ondansetron (ZOFRAN ODT) 4 MG disintegrating tablet Take 1 tablet (4 mg total) by mouth every 8 (eight) hours as needed for up to 10 doses for nausea or vomiting. 11/18/19   Sabino Donovan, MD  ondansetron (ZOFRAN ODT) 4 MG disintegrating tablet Take 1 tablet (4 mg total) by mouth every 8 (eight) hours as needed for nausea or vomiting. 11/18/19   Sabino Donovan, MD    Allergies    Patient has no known allergies.  Review of Systems   Review of Systems  Constitutional: Positive for chills. Negative for fever.  HENT: Negative for congestion and rhinorrhea.   Respiratory: Negative for cough and shortness of breath.   Cardiovascular:  Positive for chest pain. Negative for palpitations.  Gastrointestinal: Positive for anorexia, nausea and vomiting. Negative for constipation and diarrhea.  Genitourinary: Negative for difficulty urinating and dysuria.  Musculoskeletal: Negative for arthralgias and back pain.  Skin: Negative for rash and wound.  Neurological: Negative for light-headedness and headaches.    Physical Exam Updated Vital Signs BP 127/72   Pulse 75   Temp 98.1 F (36.7 C) (Oral)   Resp 19   Ht 5\' 11"  (1.803 m)   Wt 95.3 kg   LMP 11/04/2019 (Approximate)   SpO2 100%   BMI 29.29 kg/m   Physical Exam Vitals and nursing note reviewed. Exam conducted with a chaperone present.  Constitutional:      General: She is not in acute distress.    Appearance: Normal appearance.  HENT:     Head: Normocephalic and atraumatic.     Nose: No rhinorrhea.  Eyes:     General:        Right eye: No discharge.        Left eye: No discharge.     Conjunctiva/sclera: Conjunctivae normal.  Cardiovascular:     Rate and Rhythm: Normal rate. Rhythm irregular.     Heart sounds: Normal heart sounds.  Pulmonary:     Effort: Pulmonary effort is normal. No respiratory distress.     Breath sounds: No stridor. No decreased breath sounds or wheezing.  Abdominal:     General: Abdomen is flat. There is no distension.     Palpations: Abdomen is soft. There is no mass.     Tenderness: There is no abdominal tenderness.  Musculoskeletal:        General: No tenderness or signs of injury.  Skin:    General: Skin is warm and dry.     Capillary Refill: Capillary refill takes less than 2 seconds.     Nails: Clubbing:    Neurological:     General: No focal deficit present.     Mental Status: She is alert. Mental status is at baseline.     Motor: No weakness.  Psychiatric:        Mood and Affect: Mood normal.        Behavior: Behavior normal.     ED Results / Procedures / Treatments   Labs (all labs ordered are listed, but only  abnormal results are displayed) Labs Reviewed  BASIC METABOLIC PANEL - Abnormal; Notable for the following components:      Result Value   CO2 20 (*)    Creatinine, Ser 1.05 (*)  Anion gap 18 (*)    All other components within normal limits  CBC - Abnormal; Notable for the following components:   RBC 5.44 (*)    Hemoglobin 18.0 (*)    HCT 50.8 (*)    All other components within normal limits  HEPATIC FUNCTION PANEL - Abnormal; Notable for the following components:   Total Protein 8.4 (*)    Total Bilirubin 1.9 (*)    Bilirubin, Direct 0.5 (*)    Indirect Bilirubin 1.4 (*)    All other components within normal limits  LIPASE, BLOOD  I-STAT BETA HCG BLOOD, ED ([facility], [facility], [facility] ONLY)  I-STAT BETA HCG BLOOD, ED (NOT ORDERABLE)  TROPONIN I (HIGH SENSITIVITY)    EKG EKG Interpretation  Date/Time:  Monday November 18 2019 07:25:00 EDT Ventricular Rate:  88 PR Interval:    QRS Duration: 76 QT Interval:  477 QTC Calculation: 578 R Axis:   78 Text Interpretation: Sinus rhythm Minimal ST depression, inferior leads Prolonged QT interval 12 Lead; Mason-Likar Confirmed by Cherlynn Perches ([id number]) on 11/18/2019 3:26:52 PM   Radiology DG Chest 2 View  Result Date: 11/18/2019 CLINICAL DATA:  Chest pain EXAM: CHEST - 2 VIEW COMPARISON:  11/09/2019 FINDINGS: The heart size and mediastinal contours are within normal limits. Both lungs are clear. The visualized skeletal structures are unremarkable. IMPRESSION: No active cardiopulmonary disease. Electronically Signed   By: Duanne Guess D.O.   On: 11/18/2019 08:00    Procedures Procedures (including critical care time)  Medications Ordered in ED Medications  ketorolac (TORADOL) 15 MG/ML injection 15 mg (has no administration in time range)  sodium chloride flush (NS) 0.9 % injection 3 mL (3 mLs Intravenous Given 11/18/19 1608)  ondansetron (ZOFRAN-ODT) disintegrating tablet 4 mg (4 mg Oral Given 11/18/19 0926)  sodium chloride 0.9 % bolus 1,000 mL  (1,000 mLs Intravenous New Bag/Given 11/18/19 1608)  ondansetron (ZOFRAN) injection 4 mg (4 mg Intravenous Given 11/18/19 1608)    ED Course  I have reviewed the triage vital signs and the nursing notes.  Pertinent labs & imaging results that were available during my care of the patient were reviewed by me and considered in my medical decision making (see chart for details).  Clinical Course as of Nov 18 1707  Mon Nov 18, 2019  1653 Troponin I (High Sensitivity) [EK]    Clinical Course User Index [EK] Sabino Donovan, MD   MDM Rules/Calculators/A&P                          Patient's chief complaint is chest pain, she has a history of substance abuse to include IV drug abuse and cocaine abuse, her form of cocaine and smoking crack cocaine.  She has had chest pain chills nausea vomiting for the last several days.  Her last use of heroin resulted in Narcan reversal of her overdose.  Last use of crack cocaine was 2 days ago.  She is hemodynamically stable, initially tachycardic but resolved.  Laboratory studies were done in triage did show no leukocytosis no anemia, no significant kidney dysfunction.  I am adding on LFTs and a lipase, her abdomen is benign do not feel her vomiting is related to peritonitis or surgical process.  We will get a second troponin.  We will get further symptom control, we will give IV fluids and recommend resources for substance abuse.  Chest x-ray reviewed by me and radiology shows no acute cardiopulmonary pathology.  EKG reviewed  by me shows no acute ischemic change interval abnormality or arrhythmia.  The computer reading stated there are ST depressions in the inferior leads, there is a wandering baseline, I feel the ST segments are compatible with the baseline.  Laboratory analysis after final review shows no elevation of lipase, liver functions without acute significant concerns, chemistries unremarkable.  She has a mild acidosis, otherwise unremarkable, likely ketosis  and poor oral hydration and oral nutrition.  She is given a Web designer for substance abuse.  She is given a prescription for Zofran told her to take over-the-counter NSAIDs for myalgias and pains.  She agrees this plan she feels safe for discharge home.  Strict return precautions are given.   Final Clinical Impression(s) / ED Diagnoses Final diagnoses:  Non-intractable vomiting, presence of nausea not specified, unspecified vomiting type  Opioid use  Cocaine use  Chest pain, unspecified type    Rx / DC Orders ED Discharge Orders         Ordered    ondansetron (ZOFRAN ODT) 4 MG disintegrating tablet  Every 8 hours PRN     Discontinue  Reprint     11/18/19 1703    ondansetron (ZOFRAN ODT) 4 MG disintegrating tablet  Every 8 hours PRN     Discontinue  Reprint     11/18/19 1703           Sabino Donovan, MD 11/18/19 1709

## 2019-11-18 NOTE — ED Triage Notes (Signed)
Patient states she has been having intermittent mid chest pain and vomiting x 2 weeks.  Patient  states, "I think I am with drawing from Heroin and Roxy 30's. Patient states she has not used in a week."

## 2019-11-18 NOTE — Discharge Instructions (Signed)
You can take 600 mg of ibuprofen every 6 hours, you can take 1000 mg of Tylenol every 6 hours, you can alternate these every 3 or you can take them together.  

## 2020-06-06 ENCOUNTER — Other Ambulatory Visit: Payer: Self-pay

## 2020-06-06 ENCOUNTER — Emergency Department (HOSPITAL_COMMUNITY)
Admission: EM | Admit: 2020-06-06 | Discharge: 2020-06-09 | Disposition: A | Discharge status: Home / Self Care | Payer: Medicaid Other | Attending: Emergency Medicine | Admitting: Emergency Medicine

## 2020-06-06 DIAGNOSIS — F1193 Opioid use, unspecified with withdrawal: Secondary | ICD-10-CM

## 2020-06-06 DIAGNOSIS — F1123 Opioid dependence with withdrawal: Secondary | ICD-10-CM

## 2020-06-06 DIAGNOSIS — R197 Diarrhea, unspecified: Secondary | ICD-10-CM | POA: Insufficient documentation

## 2020-06-06 DIAGNOSIS — F1113 Opioid abuse with withdrawal: Secondary | ICD-10-CM | POA: Insufficient documentation

## 2020-06-06 DIAGNOSIS — F17210 Nicotine dependence, cigarettes, uncomplicated: Secondary | ICD-10-CM | POA: Insufficient documentation

## 2020-06-06 LAB — I-STAT BETA HCG BLOOD, ED (MC, WL, AP ONLY): I-stat hCG, quantitative: <5 m[IU]/mL (ref <5)

## 2020-06-06 LAB — COMPREHENSIVE METABOLIC PANEL
ALT: 10 U/L (ref 0–44)
AST: 14 U/L — ABNORMAL LOW (ref 15–41)
Albumin: 3.9 g/dL (ref 3.5–5.0)
Alkaline Phosphatase: 53 U/L (ref 38–126)
Anion gap: 15 (ref 5–15)
BUN: 13 mg/dL (ref 6–20)
CO2: 22 mmol/L (ref 22–32)
Calcium: 9.5 mg/dL (ref 8.9–10.3)
Chloride: 104 mmol/L (ref 98–111)
Creatinine, Ser: 1.07 mg/dL — ABNORMAL HIGH (ref 0.44–1.00)
GFR, Estimated: >60 mL/min (ref >60)
Glucose, Bld: 121 mg/dL — ABNORMAL HIGH (ref 70–99)
Potassium: 3.2 mmol/L — ABNORMAL LOW (ref 3.5–5.1)
Sodium: 141 mmol/L (ref 135–145)
Total Bilirubin: 1 mg/dL (ref 0.3–1.2)
Total Protein: 6.7 g/dL (ref 6.5–8.1)

## 2020-06-06 LAB — RAPID URINE DRUG SCREEN, HOSP PERFORMED
Amphetamines: NOT DETECTED
Barbiturates: NOT DETECTED
Benzodiazepines: NOT DETECTED
Cocaine: POSITIVE — AB
Opiates: POSITIVE — AB
Tetrahydrocannabinol: POSITIVE — AB

## 2020-06-06 LAB — CBC
HCT: 41.7 % (ref 36.0–46.0)
Hemoglobin: 14 g/dL (ref 12.0–15.0)
MCH: 30.6 pg (ref 26.0–34.0)
MCHC: 33.6 g/dL (ref 30.0–36.0)
MCV: 91 fL (ref 80.0–100.0)
Platelets: 325 10*3/uL (ref 150–400)
RBC: 4.58 MIL/uL (ref 3.87–5.11)
RDW: 12.6 % (ref 11.5–15.5)
WBC: 7.8 10*3/uL (ref 4.0–10.5)
nRBC: 0 % (ref 0.0–0.2)

## 2020-06-06 LAB — CBG MONITORING, ED: Glucose-Capillary: 104 mg/dL — ABNORMAL HIGH (ref 70–99)

## 2020-06-06 LAB — ETHANOL: Alcohol, Ethyl (B): <10 mg/dL (ref <10)

## 2020-06-06 MED ORDER — ONDANSETRON 4 MG PO TBDP
4.0000 mg | ORAL_TABLET | Freq: Once | ORAL | Status: AC
  Administered 2020-06-06: 4 mg via ORAL
  Filled 2020-06-06: qty 1

## 2020-06-06 MED ORDER — CLONIDINE HCL 0.1 MG PO TABS
0.1000 mg | ORAL_TABLET | Freq: Two times a day (BID) | ORAL | 0 refills | Status: DC
Start: 2020-06-06 — End: 2021-07-07

## 2020-06-06 MED ORDER — CLONIDINE HCL 0.1 MG PO TABS
0.1000 mg | ORAL_TABLET | Freq: Once | ORAL | Status: AC
  Administered 2020-06-06: 0.1 mg via ORAL
  Filled 2020-06-06: qty 1

## 2020-06-06 MED ORDER — PROMETHAZINE HCL 25 MG PO TABS
25.0000 mg | ORAL_TABLET | Freq: Four times a day (QID) | ORAL | 0 refills | Status: DC | PRN
Start: 2020-06-06 — End: 2021-07-07

## 2020-06-06 NOTE — ED Notes (Signed)
Patient vomiting states she passed out in the bathroom . Patient is ambulatory tearful

## 2020-06-06 NOTE — ED Provider Notes (Signed)
[facility] EMERGENCY DEPARTMENT Provider Note   CSN: [id number] Arrival date & time: 06/06/20  0035     History Chief Complaint  Patient presents with  . Withdrawal    Makayla Kramer is a 30 y.o. female.  The history is provided by the patient.   Makayla Kramer is a 30 y.o. female who presents to the Emergency Department complaining of withdrawal from fentanyl. She presents the emergency department requesting help with fentanyl withdrawal. She has been abusing the drug for several years. Last use was two days ago. She reports numerous episodes of emesis with mild diarrhea. She is seeking assistance with detox. She denies any SI, HI, abdominal pain. She uses fentanyl by snorting. Symptoms are moderate to severe, constant in nature. She states that overall they are improving over the last several hours.    Past Medical History:  Diagnosis Date  . Gonorrhea   . 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 Mental disorder   . Syphilis   . Trichomonas     Patient Active Problem List   Diagnosis Date Noted  . PELVIC PAIN, CHRONIC 05/15/2008  . HERPES LABIALIS 04/10/2007  . Condyloma acuminatum 06/29/2006  . OBESITY, NOS 06/29/2006  . BIPOLAR DISORDER 06/29/2006    Past Surgical History:  Procedure Laterality Date  . TOOTH EXTRACTION       OB History    Gravida  2   Para  1   Term  1   Preterm      AB      Living  1     SAB      IAB      Ectopic      Multiple      Live Births  1           Family History  Problem Relation Age of Onset  . Diabetes Maternal Grandmother     Social History   Tobacco Use  . Smoking status: Current Every Day Smoker    Packs/day: 0.25    Years: 3.00    Pack years: 0.75    Types: Cigarettes  . Smokeless tobacco: Never Used  Vaping Use  . Vaping Use: Never used  Substance Use Topics  . Alcohol use: Yes  . Drug use: Yes    Types: Marijuana    Comment: heroin, Roxy    Home  Medications Prior to Admission medications   Medication Sig Start Date End Date Taking? Authorizing Provider  cloNIDine (CATAPRES) 0.1 MG tablet Take 1 tablet (0.1 mg total) by mouth 2 (two) times daily. 06/06/20  Yes Tilden Fossa, MD  promethazine (PHENERGAN) 25 MG tablet Take 1 tablet (25 mg total) by mouth every 6 (six) hours as needed for nausea or vomiting. 06/06/20  Yes Tilden Fossa, MD  Acetaminophen-Caff-Pyrilamine (MIDOL COMPLETE PO) Take 1 tablet by mouth daily as needed (pain).     [provider]  cyclobenzaprine (FLEXERIL) 10 MG tablet Take 1 tablet (10 mg total) by mouth 2 (two) times daily as needed for muscle spasms. Patient not taking: Reported on 11/18/2019 12/07/12   Felicie Morn, NP  famotidine (PEPCID) 20 MG tablet Take 1 tablet (20 mg total) by mouth 2 (two) times daily as needed for heartburn or indigestion. Patient not taking: Reported on 11/18/2019 11/10/19   Petrucelli, Pleas Koch, PA-C  HYDROcodone-acetaminophen (NORCO/VICODIN) 5-325 MG per tablet Take 1 tablet by mouth every 6 (six) hours as needed for pain. Patient  not taking: Reported on 11/18/2019 12/07/12   Felicie Morn, NP  ibuprofen (ADVIL,MOTRIN) 800 MG tablet Take 1 tablet (800 mg total) by mouth 3 (three) times daily. Patient not taking: Reported on 11/18/2019 07/16/18   Elson Areas, PA-C  methocarbamol (ROBAXIN) 500 MG tablet Take 1 tablet (500 mg total) by mouth 2 (two) times daily. Patient not taking: Reported on 11/18/2019 07/16/18   Elson Areas, PA-C  ondansetron (ZOFRAN ODT) 4 MG disintegrating tablet Take 1 tablet (4 mg total) by mouth every 8 (eight) hours as needed for up to 10 doses for nausea or vomiting. 11/18/19   Sabino Donovan, MD  ondansetron (ZOFRAN ODT) 4 MG disintegrating tablet Take 1 tablet (4 mg total) by mouth every 8 (eight) hours as needed for nausea or vomiting. 11/18/19   Sabino Donovan, MD    Allergies    Patient has no known allergies.  Review of Systems   Review of Systems   All other systems reviewed and are negative.   Physical Exam Updated Vital Signs BP 122/77 (BP Location: Right Arm)   Pulse 74   Temp 98.3 F (36.8 C) (Oral)   Resp 16   SpO2 100%   Physical Exam Vitals and nursing note reviewed.  Constitutional:      Appearance: She is well-developed and well-nourished.  HENT:     Head: Normocephalic and atraumatic.  Cardiovascular:     Rate and Rhythm: Normal rate and regular rhythm.     Heart sounds: No murmur heard.   Pulmonary:     Effort: Pulmonary effort is normal. No respiratory distress.     Breath sounds: Normal breath sounds.  Abdominal:     Palpations: Abdomen is soft.     Tenderness: There is no abdominal tenderness. There is no guarding or rebound.  Musculoskeletal:        General: No tenderness or edema.  Skin:    General: Skin is warm and dry.  Neurological:     Mental Status: She is alert and oriented to person, place, and time.  Psychiatric:        Mood and Affect: Mood and affect normal.        Behavior: Behavior normal.     ED Results / Procedures / Treatments   Labs (all labs ordered are listed, but only abnormal results are displayed) Labs Reviewed  COMPREHENSIVE METABOLIC PANEL - Abnormal; Notable for the following components:      Result Value   Potassium 3.2 (*)    Glucose, Bld 121 (*)    Creatinine, Ser 1.07 (*)    AST 14 (*)    All other components within normal limits  RAPID URINE DRUG SCREEN, HOSP PERFORMED - Abnormal; Notable for the following components:   Opiates POSITIVE (*)    Cocaine POSITIVE (*)    Tetrahydrocannabinol POSITIVE (*)    All other components within normal limits  CBG MONITORING, ED - Abnormal; Notable for the following components:   Glucose-Capillary 104 (*)    All other components within normal limits  ETHANOL  CBC  I-STAT BETA HCG BLOOD, ED (MC, WL, AP ONLY)    EKG None  Radiology No results found.  Procedures Procedures   Medications Ordered in  ED Medications  ondansetron (ZOFRAN-ODT) disintegrating tablet 4 mg (has no administration in time range)  cloNIDine (CATAPRES) tablet 0.1 mg (has no administration in time range)  ondansetron (ZOFRAN-ODT) disintegrating tablet 4 mg (4 mg Oral Given 06/06/20 0407)  ED Course  I have reviewed the triage vital signs and the nursing notes.  Pertinent labs & imaging results that were available during my care of the patient were reviewed by me and considered in my medical decision making (see chart for details).    MDM Rules/Calculators/A&P                         patient here for evaluation of nausea, vomiting, diarrhea and setting of fentanyl withdrawal. She is non-toxic appearing on evaluation with no significant abdominal tenderness. She appears euvolemic on evaluation. Discussed treatment for fentanyl lwithdrawal with symptom management. Providing resources for outpatient detox. Discussed home care and return precautions.  Final Clinical Impression(s) / ED Diagnoses Final diagnoses:  Opioid withdrawal (HCC)    Rx / DC Orders ED Discharge Orders         Ordered    cloNIDine (CATAPRES) 0.1 MG tablet  2 times daily        06/06/20 1726    promethazine (PHENERGAN) 25 MG tablet  Every 6 hours PRN        06/06/20 1726           Tilden Fossa, MD 06/07/20 0105

## 2020-06-06 NOTE — ED Notes (Signed)
gingerale given

## 2020-06-06 NOTE — ED Notes (Signed)
Pt refused vitals 

## 2020-06-06 NOTE — ED Notes (Signed)
Called pt x2 for vitals, no response. °

## 2020-06-06 NOTE — ED Notes (Signed)
Pt c/o nausea  Getting ready for  discharge

## 2020-06-06 NOTE — ED Triage Notes (Addendum)
Pt presents to ED POV. Pt c/o withdrawal from fentanyl. Pt reports that she last took it 2d ago. Pt lethargic in triage. COWS - 3

## 2021-07-07 ENCOUNTER — Ambulatory Visit: Payer: Medicaid Other

## 2021-07-07 ENCOUNTER — Encounter: Payer: Self-pay | Admitting: General Practice

## 2021-07-07 ENCOUNTER — Other Ambulatory Visit: Payer: Self-pay

## 2021-07-07 ENCOUNTER — Ambulatory Visit (INDEPENDENT_AMBULATORY_CARE_PROVIDER_SITE_OTHER): Payer: Medicaid Other | Admitting: Family Medicine

## 2021-07-07 VITALS — BP 111/62 | HR 59 | Ht 71.0 in | Wt 217.0 lb

## 2021-07-07 DIAGNOSIS — Z3491 Encounter for supervision of normal pregnancy, unspecified, first trimester: Secondary | ICD-10-CM

## 2021-07-07 DIAGNOSIS — Z3201 Encounter for pregnancy test, result positive: Secondary | ICD-10-CM | POA: Diagnosis not present

## 2021-07-07 DIAGNOSIS — Z3490 Encounter for supervision of normal pregnancy, unspecified, unspecified trimester: Secondary | ICD-10-CM

## 2021-07-07 LAB — POCT PREGNANCY, URINE: Preg Test, Ur: POSITIVE — AB

## 2021-07-07 MED ORDER — PROMETHAZINE HCL 25 MG PO TABS: 25.0000 mg | ORAL_TABLET | Freq: Four times a day (QID) | ORAL | 0 refills | Status: DC | PRN

## 2021-07-07 MED ORDER — PREPLUS 27-1 MG PO TABS: 1.0000 | ORAL_TABLET | Freq: Every day | ORAL | 11 refills | Status: DC

## 2021-07-07 NOTE — Progress Notes (Signed)
Patient presents to office today for UPT. UPT + ?Patient reports first positive home test last week on 3/1. LMP 1/10 and 1/21 approximately, no period in February. Patient states she noticed starting to feel nauseous around early February. Scheduled ultrasound for  3/16 @ 2pm to confirm dating. Patient requests Rx for PNV & Phenergan. Rx sent in per protocol.  ? ?Chase Caller RN BSN ?07/07/21 ? ?

## 2021-07-07 NOTE — Progress Notes (Signed)
?  History:  ?[name] is a 31 y.o. G3P1011 who presents to clinic today with complaint of possible pregnancy.  ? ?UPT positive here ?Not exactly planned but very welcome pregnancy ?One prior pregnancy with NSVD ten years prior ?No medical conditions ? ? ?Past Medical History:  ?Diagnosis Date  ? Gonorrhea   ? Herpes simplex   ? hsv (outbreak over a year ago)  ? Mental disorder   ? Syphilis   ? Trichomonas   ? ? ?Past Surgical History:  ?Procedure Laterality Date  ? TOOTH EXTRACTION    ? ? ?The following portions of the patient's history were reviewed and updated as appropriate: allergies, current medications, past family history, past medical history, past social history, past surgical history and problem list.  ? ?Review of Systems:  ?Pertinent items noted in HPI and remainder of comprehensive ROS otherwise negative. ? ?Objective:  ?Physical Exam ?BP 111/62   Pulse (!) 59   Ht 5\' 11"  (1.803 m)   Wt 217 lb (98.4 kg)   BMI 30.27 kg/m?  ?Physical Exam ? ? ?Labs and Imaging ?Results for orders placed or performed in visit on 07/07/21 (from the past 24 hour(s))  ?Pregnancy, urine POC     Status: Abnormal  ? Collection Time: 07/07/21  3:22 PM  ?Result Value Ref Range  ? Preg Test, Ur POSITIVE (A) NEGATIVE  ? ? ?No results found. ? ? ?Assessment & Plan:  ?1. Early stage of pregnancy ?Congratulated ?Given uncertain dates will get dating [place] ?Remainder of issues reviewed from RN note and I am in agreement with management paln ?- Prenatal Vit-Fe Fumarate-FA (PREPLUS) 27-1 MG TABS; Take 1 tablet by mouth daily.  Dispense: 30 tablet; Refill: 11 ?- promethazine (PHENERGAN) 25 MG tablet; Take 1 tablet (25 mg total) by mouth every 6 (six) hours as needed for nausea or vomiting.  Dispense: 30 tablet; Refill: 0 ? ?2. Pregnancy with uncertain date of last menstrual period in first trimester, antepartum ? ?- US PELVIC COMPLETE WITH TRANSVAGINAL; Future ? ? ? ?Approximately 15 minutes of total time was spent with this  patient on history taking, coordination of care, education and documentation.  ? ?Clarnce Flock, MD ?07/07/2021 ?3:50 PM ? ?

## 2021-07-15 ENCOUNTER — Ambulatory Visit
Admission: RE | Admit: 2021-07-15 | Discharge: 2021-07-15 | Disposition: A | Discharge status: Home / Self Care | Payer: Medicaid Other | Source: Ambulatory Visit | Attending: Family Medicine | Admitting: Family Medicine

## 2021-07-15 ENCOUNTER — Other Ambulatory Visit: Payer: Self-pay | Admitting: Family Medicine

## 2021-07-15 ENCOUNTER — Other Ambulatory Visit: Payer: Self-pay | Admitting: General Practice

## 2021-07-15 ENCOUNTER — Other Ambulatory Visit: Payer: Self-pay

## 2021-07-15 DIAGNOSIS — Z3A10 10 weeks gestation of pregnancy: Secondary | ICD-10-CM | POA: Insufficient documentation

## 2021-07-15 DIAGNOSIS — Z3490 Encounter for supervision of normal pregnancy, unspecified, unspecified trimester: Secondary | ICD-10-CM | POA: Insufficient documentation

## 2021-07-15 DIAGNOSIS — Z3687 Encounter for antenatal screening for uncertain dates: Secondary | ICD-10-CM | POA: Insufficient documentation

## 2021-07-19 ENCOUNTER — Telehealth: Payer: Self-pay

## 2021-07-19 NOTE — Telephone Encounter (Addendum)
-----   Message from Clarnce Flock, MD sent at 07/19/2021  1:27 PM EDT ----- ?Significant discrepancy with reported LMP and [place] dating ?Please update EDC to 02/07/2022 ?Will notify patient by MyChart ?Also needs new OB appt setup if she's coming to see [place] ? ? ?Dating information updated. Appts scheduled. Pt called with new appts. ?

## 2021-08-04 ENCOUNTER — Telehealth (INDEPENDENT_AMBULATORY_CARE_PROVIDER_SITE_OTHER): Payer: Self-pay

## 2021-08-04 DIAGNOSIS — R110 Nausea: Secondary | ICD-10-CM

## 2021-08-04 DIAGNOSIS — G4700 Insomnia, unspecified: Secondary | ICD-10-CM

## 2021-08-04 DIAGNOSIS — Z3A00 Weeks of gestation of pregnancy not specified: Secondary | ICD-10-CM

## 2021-08-04 DIAGNOSIS — Z3490 Encounter for supervision of normal pregnancy, unspecified, unspecified trimester: Secondary | ICD-10-CM

## 2021-08-04 DIAGNOSIS — Z3480 Encounter for supervision of other normal pregnancy, unspecified trimester: Secondary | ICD-10-CM | POA: Insufficient documentation

## 2021-08-04 MED ORDER — PROMETHAZINE HCL 25 MG PO TABS: 25.0000 mg | ORAL_TABLET | Freq: Four times a day (QID) | ORAL | 0 refills | Status: DC | PRN

## 2021-08-04 MED ORDER — UNISOM SLEEPTABS 25 MG PO TABS: 25.0000 mg | ORAL_TABLET | Freq: Every evening | ORAL | 0 refills | Status: DC | PRN

## 2021-08-04 NOTE — Patient Instructions (Addendum)
Safe Medications in Pregnancy  ? ?Acne:  ?Benzoyl Peroxide  ?Salicylic Acid  ? ?Backache/Headache:  ?Tylenol: 2 regular strength every 4 hours OR  ?             2 Extra strength every 6 hours  ? ?Colds/Coughs/Allergies:  ?Benadryl (alcohol free) 25 mg every 6 hours as needed  ?Breath right strips  ?Claritin  ?Cepacol throat lozenges  ?Chloraseptic throat spray  ?Cold-Eeze- up to three times per day  ?Cough drops, alcohol free  ?Flonase (by prescription only)  ?Guaifenesin  ?Mucinex  ?Robitussin DM (plain only, alcohol free)  ?Saline nasal spray/drops  ?Sudafed (pseudoephedrine) & Actifed * use only after [redacted] weeks gestation and if you do not have high blood pressure  ?Tylenol  ?Vicks Vaporub  ?Zinc lozenges  ?Zyrtec  ? ?Constipation:  ?Colace  ?Ducolax suppositories  ?Fleet enema  ?Glycerin suppositories  ?Metamucil  ?Milk of magnesia  ?Miralax  ?Senokot  ?Smooth move tea  ? ?Diarrhea:  ?Kaopectate  ?Imodium A-D  ? ?*NO pepto Bismol  ? ?Hemorrhoids:  ?Anusol  ?Anusol HC  ?Preparation H  ?Tucks  ? ?Indigestion:  ?Tums  ?Maalox  ?Mylanta  ?Zantac  ?Pepcid  ? ?Insomnia:  ?Benadryl (alcohol free) 25mg  every 6 hours as needed  ?Tylenol PM  ?Unisom, no Gelcaps  ? ?Leg Cramps:  ?Tums  ?MagGel  ? ?Nausea/Vomiting:  ?Bonine  ?Dramamine  ?Emetrol  ?Ginger extract  ?Sea bands  ?Meclizine  ?Nausea medication to take during pregnancy:  ?Unisom (doxylamine succinate 25 mg tablets) Take one tablet daily at bedtime. If symptoms are not adequately controlled, the dose can be increased to a maximum recommended dose of two tablets daily (1/2 tablet in the morning, 1/2 tablet mid-afternoon and one at bedtime).  ?Vitamin B6 100mg  tablets. Take one tablet twice a day (up to 200 mg per day).  ? ?Skin Rashes:  ?Aveeno products  ?Benadryl cream or 25mg  every 6 hours as needed  ?Calamine Lotion  ?1% cortisone cream  ? ?Yeast infection:  ?Gyne-lotrimin 7  ?Monistat 7  ? ? ?**If taking multiple medications, please check labels to avoid  duplicating the same active ingredients  ?**take medication as directed on the label  ?** Do not exceed 4000 mg of tylenol in 24 hours  ?**Do not take medications that contain aspirin or ibuprofen  ? ?AREA PEDIATRIC/FAMILY PRACTICE PHYSICIANS ? ?[place] [phone]) ?[place] ?Erin Hearing, MD; Gwendlyn Deutscher, MD; Walker Kehr, MD; Andria Frames, MD; McDiarmid, MD; Dutch Quint, MD; Nori Riis, MD; Mingo Amber, MD ?[address] [place], [place] [address] ?([phone] ?Mon-Fri 8:30-12:30, 1:30-5:00 ?Providers come to see babies at [facility] ?Accepting Medicaid ?[place] at [facility] ?Limited providers who accept newborns: Dorthy Cooler, MD; Orland Mustard, MD; Stephanie Acre, MD ?[address] [place], [place], [place] [address] ?([phone] ?Mon-Fri 8:00-5:30 ?Babies seen by providers at [facility] ?Does NOT accept Medicaid ?Please call early in hospitalization for appointment (limited availability)  ?[facility] ?Amil Amen, MD ?[address] [place], [place] [address] ?[phone] ?Mon, Tue, Thur, Fri 8:30-5:00, Wed 10:00-7:00 (closed 1-2pm) ?Babies seen by [facility] providers ?Accepting Medicaid ?Truddie Coco - Pediatrician ?Truddie Coco, MD ?[address], [place], [place] [address] ?([phone] ?Mon-Fri 8:30-5:00, Sat 8:30-12:00 ?Provider comes to see babies at [facility] ?Accepting Medicaid ?Must have been referred from current patients or contacted office prior to delivery ?[place] [facility] [place]) ?Owens Shark, MD; Tamera Punt, MD; Doneen Poisson, MD; Fatima Sanger, MD; Wynetta Emery, MD; Jess Barters, MD;  Tami Ribas, MD; Herbert Moors, MD; Derrell Lolling, MD; Dorothyann Peng, MD; Lucious Groves, NP; Baldo Ash, NP ?[address], [place], [place] [address] ?[phone] ?Mon, Tue, Thur, Fri 8:30-5:30, Wed 9:30-5:30, Sat 8:30-12:30 ?Babies seen by [facility] providers ?Accepting Medicaid ?Only accepting infants of first-time parents or siblings  of current patients ?Hospital discharge coordinator will make follow-up appointment ?Baltazar Najjar ?[address] [place], [place]  [address] ?[phone]   Fax - [phone] ?[facility] ?[place], [place], [place]  [address] ?Phone - [phone]   Fax - [phone] ?Shilpa Gosrani ?[address] [place], [place], [place]  [address] ?[phone] ? ?[place] [phone]) ?[place] ?Redmond Baseman, MD; Jacklynn Ganong, MD; Torrie Mayers, MD; MD; Rosana Hoes, MD; Servando Salina, MD; Rose Fillers, MD; Rex Kras, MD; Corinna Capra, MD; Volney American, MD; Trilby Drummer, MD; Janann Colonel, MD; Jimmye Norman, MD ?[address] [place], [place] [address] ?[phone] ?Mon-Fri 8:30-5:00 (extended evenings Mon-Thur as needed), Sat-Sun 10:00-1:00 ?Providers come to see babies at [facility] ?Accepting Medicaid for families of first-time babies and families with all children in the household age 26 and under. Must register with office prior to making appointment (M-F only). ?[place] ?Raenette Rover, NP; Tomi Bamberger, MD; Redmond School, MD; [place], [place] ?[address] [place], [place] [address] ?([phone] ?Mon-Fri 8:00-5:00 ?Babies seen by providers at [facility] ?Does NOT accept Medicaid/Commercial Insurance Only ?[facility]  ?Sabino Gasser, MD; Drema Dallas, MD; Montine Circle, MD; Vilma Prader, MD; Vanita Panda, MD; Alfonso Ramus, MD; Ruthann Cancer, MD; Roxanne Mins, MD; Rosalva Ferron, MD; Polly Cobia, MD ?[address] [place], [place] [address] ?([phone] ?Mon-Fri 8:30-5:30, Sat (Oct.-Mar.) 9:00-1:00 ?Babies seen by providers at [facility] ?Accepting Medicaid ? ?[place] [phone]) ?[facility] ?Joneen Caraway, MD; Suzan Slick, MD ?[address] [place], [place], [place] [address] ?[phone] ?Mon-Fri 8:30-5:00, Sat 8:30-12:00 ?Providers come to see babies at [facility] ?Does NOT accept Medicaid ?[place] at [facility] ?Deneise Lever, [place]; [place], MD; [place], [place]; [name], MD;  Moreen Fowler, MD ?[address] [place], [place] [address] ?([phone] ?Mon-Fri 8:00-5:00 ?Babies seen by providers at [facility] ?Does NOT accept Medicaid ?Only accepting babies of parents who are patients ?Please call early in hospitalization for appointment (limited availability) ?[facility] ?Carlis Abbott, MD; Sharlene Motts, MD; Rod Can, MD; Warner Mccreedy, NP; Sabra Heck, MD; Ermalinda Memos, MD; Sharlett Iles, NP; Aurther Loft, MD; Jerrye Beavers, MD; Marcello Moores, MD; Berline Lopes, MD; Charolette Forward, MD ?[address] [place], [place], [place] [address] ?[phone] ?Mon-Fri 8:00-5:00, Sat 9:00-12:00 ?Providers come to see babies at [facility] ?Does NOT accept Medicaid ? ?[facility] [phone]) ?[place] at [facility] ?Limited providers accepting new patients: Dayna Ramus, NP; [place], [place] ?[address], [place], [place] [address] ?[phone] ?Mon-Fri 8:00-5:00 ?Babies seen by providers at [facility] ?Does NOT accept Medicaid ?Only accepting babies of parents who are patients ?Please call early in hospitalization for appointment (limited availability) ?[facility] ?Abner Greenspan, MD; Sheran Lawless, MD ?[address] [place], [place] [address] ?[phone] (press 1 to schedule appointment) ?Mon-Fri 8:00-5:00 ?Providers come to see babies at [facility] ?Does NOT accept Medicaid ?[facility] ?Mazer, MD ?[address] [place], [place] [address] ?([phone] ?Mon-Fri 8:30-5:00 (lunch 12:30-1:00), extended hours by appointment only Wed 5:00-6:30 ?Babies seen by [facility] providers ?Accepting Medicaid ?Therapist, music at [place] ?Volanda [place], MD; [place], MD; Ethlyn Gallery, MD ?[address], [place], [place] [address] ?[phone] ?Mon-Fri 8:00-5:00 ?Babies seen by [facility] providers ?Does NOT accept Medicaid ?Therapist, music at [employer] ?Jerline Pain, MD; Yong Channel, MD; Swartz, DO ?[address] [place], [place] [address] ?([phone] ?Mon-Fri 8:00-5:00 ?Babies  seen by [facility]  providers ?Does NOT accept Medicaid ?[facility] ?Erlene Quan, [place]; [place], [place]; [place], NP; Albertina Parr, MD; Frederic Jericho, MD; Ronney Lion, MD; Carlos Levering, NP; Jerelene Redden, NP; Tomasita Crumble, NP; Ronelle Nigh, NP; Corinna Lines, MD; Karsten Ro, MD ?[place]

## 2021-08-04 NOTE — Progress Notes (Signed)
New OB Intake ? ?I connected with  Kingsley Plan on 08/04/21 at  2:15 PM EDT by telephone Video Visit and verified that I am speaking with the correct person using two identifiers. Nurse is located at [facility] and pt is located at [employer]. ? ?I discussed the limitations, risks, security and privacy concerns of performing an evaluation and management service by telephone and the availability of in person appointments. I also discussed with the patient that there may be a patient responsible charge related to this service. The patient expressed understanding and agreed to proceed. ? ?I explained I am completing New OB Intake today. We discussed her EDD of 02/07/22 that is based on U/S on 07/15/21. Pt is G3/P1. I reviewed her allergies, medications, Medical/Surgical/OB history, and appropriate screenings. I informed her of [facility] services. Based on history, this is a/an  pregnancy uncomplicated .  ? ?Patient Active Problem List  ? Diagnosis Date Noted  ? Supervision of other normal pregnancy, antepartum 08/04/2021  ? PELVIC PAIN, CHRONIC 05/15/2008  ? HERPES LABIALIS 04/10/2007  ? Condyloma acuminatum 06/29/2006  ? OBESITY, NOS 06/29/2006  ? BIPOLAR DISORDER 06/29/2006  ? ? ?Concerns addressed today ? ?Delivery Plans:  ?Plans to deliver at [facility].  ? ?MyChart/Babyscripts ?MyChart access verified. I explained pt will have some visits in office and some virtually. Babyscripts instructions given and order placed. Patient verifies receipt of registration text/e-mail. Account successfully created and app downloaded. ? ?Blood Pressure Cuff  ?Blood pressure cuff ordered for patient to pick-up from [employer]. Explained after first prenatal appt pt will check weekly and document in Babyscripts. ? ?Weight scale: Patient does / does not  have weight scale. Weight scale ordered for patient to pick up from [employer].  ? ?Anatomy US ?Explained first scheduled [place] will be around 19 weeks. Anatomy US scheduled for  09/13/21 at 0130. Pt notified to arrive at 0115. ?Scheduled AFP lab only appointment if CenteringPregnancy pt for same day as anatomy US.  ? ?Labs ?Discussed Avelina Laine genetic screening with patient. Would like both Panorama and Horizon drawn at new OB visit.Also if interested in genetic testing, tell patient she will need AFP 15-21 weeks to complete genetic testing .Routine prenatal labs needed. ? ?Covid Vaccine ?Patient has not covid vaccine.  ? ?Is patient a CenteringPregnancy candidate? Not a candidate  "Centering Patient" indicated on sticky note ?  ?Is patient a Mom+Baby Combined Care candidate? Not a candidate   Scheduled with Mom+Baby provider  ?  ?Is patient interested in [place]? No  "Interested in [employer] - Schedule next visit with CNM" on sticky note ? ?Informed patient of [facility] website  and placed link in her AVS.  ? ?Social Determinants of Health ?Food Insecurity: Patient expresses food insecurity. Food Market information given to patient; explained patient may visit at the end of first OB appointment. ?WIC Referral: Patient is interested in referral to [facility].  ?Transportation: Patient denies transportation needs. ?Childcare: Discussed no children allowed at ultrasound appointments. Offered childcare services; patient declines childcare services at this time. ? ?Send link to Pregnancy Navigators ? ? ?Placed OB Box on problem list and updated ? ?First visit review ?I reviewed new OB appt with pt. I explained she will have a pelvic exam, ob bloodwork with genetic screening, and PAP smear. Explained pt will be seen by Dr. Shawnie Pons at first visit; encounter routed to appropriate provider. Explained that patient will be seen by pregnancy navigator following visit with provider. [facility] information placed  in AVS.  ? ?Henrietta Dine, CMA ?08/04/2021  3:05 PM  ?

## 2021-08-16 ENCOUNTER — Encounter: Payer: Medicaid Other | Admitting: Family Medicine

## 2021-09-01 ENCOUNTER — Encounter: Payer: Self-pay | Admitting: Obstetrics & Gynecology

## 2021-09-01 ENCOUNTER — Other Ambulatory Visit (HOSPITAL_COMMUNITY)
Admission: RE | Admit: 2021-09-01 | Discharge: 2021-09-01 | Disposition: A | Discharge status: Home / Self Care | Payer: Medicaid Other | Source: Ambulatory Visit | Attending: Family Medicine | Admitting: Family Medicine

## 2021-09-01 ENCOUNTER — Ambulatory Visit (INDEPENDENT_AMBULATORY_CARE_PROVIDER_SITE_OTHER): Payer: Medicaid Other | Admitting: Obstetrics & Gynecology

## 2021-09-01 VITALS — BP 123/72 | HR 90 | Wt 215.0 lb

## 2021-09-01 DIAGNOSIS — N898 Other specified noninflammatory disorders of vagina: Secondary | ICD-10-CM | POA: Insufficient documentation

## 2021-09-01 DIAGNOSIS — Z3480 Encounter for supervision of other normal pregnancy, unspecified trimester: Secondary | ICD-10-CM | POA: Insufficient documentation

## 2021-09-01 NOTE — Progress Notes (Signed)
?  Subjective:  ? ? Makayla Kramer is a G3P1011 [redacted]w[redacted]d being seen today for her first obstetrical visit.  Her obstetrical history is significant for smoker and h/o drug use . Patient does intend to breast feed. Pregnancy history fully reviewed. ? ?Patient reports nausea. ? ?Vitals:  ? 09/01/21 1121  ?BP: 123/72  ?Pulse: 90  ?Weight: 215 lb (97.5 kg)  ? ? ?HISTORY: ?OB History  ?Gravida Para Term Preterm AB Living  ?3 1 1  0 1 1  ?SAB IAB Ectopic Multiple Live Births  ?1 0 0 0 1  ?  ?# Outcome Date GA Lbr Len/2nd Weight Sex Delivery Anes PTL Lv  ?3 Current           ?2 SAB 2019          ?1 Term 06/05/11 [redacted]w[redacted]d 13:13 / 00:39 5 lb 11 oz (2.58 kg) M Vag-Vacuum EPI  LIV  ?   Birth Comments: wnl  ? ?Past Medical History:  ?Diagnosis Date  ? Gonorrhea   ? Herpes simplex   ? hsv (outbreak over a year ago)  ? Mental disorder   ? Syphilis   ? Trichomonas   ? ?Past Surgical History:  ?Procedure Laterality Date  ? TOOTH EXTRACTION    ? ?Family History  ?Problem Relation Age of Onset  ? Diabetes Maternal Grandmother   ? ? ? ?Exam  ? ? ?Uterus:   17 week  ?Pelvic Exam:   ? Perineum: No Hemorrhoids  ? Vulva: normal  ? Vagina:  normal mucosa  ? pH:    ? Cervix: no lesions  ? Adnexa: normal adnexa  ? Bony Pelvis: average  ?System: Breast:  normal appearance, no masses or tenderness  ? Skin: normal coloration and turgor, no rashes ?  ? Neurologic: normal  ? Extremities: normal strength, tone, and muscle mass  ? HEENT PERRLA, extra ocular movement intact, oropharynx clear, no lesions, and thyroid without masses  ? Mouth/Teeth mucous membranes moist, pharynx normal without lesions and dental hygiene good  ? Neck supple  ? Cardiovascular: regular rate and rhythm, no murmurs or gallops  ? Respiratory:  appears well, vitals normal, no respiratory distress, acyanotic, normal RR, neck free of mass or lymphadenopathy, chest clear, no wheezing, crepitations, rhonchi, normal symmetric air entry  ? Abdomen: soft, non-tender; bowel sounds normal;  no masses,  no organomegaly  ? Urinary: urethral meatus normal  ? ? ?  ?Assessment:  ? ? Pregnancy: G3P1011 ?Patient Active Problem List  ? Diagnosis Date Noted  ? Supervision of other normal pregnancy, antepartum 08/04/2021  ? PELVIC PAIN, CHRONIC 05/15/2008  ? HERPES LABIALIS 04/10/2007  ? Condyloma acuminatum 06/29/2006  ? OBESITY, NOS 06/29/2006  ? BIPOLAR DISORDER 06/29/2006  ? ?  ? ?  ?Plan:  ? ?  ?Initial labs drawn. ?Prenatal vitamins. ?Problem list reviewed and updated. ?Genetic Screening discussed : ordered. ? Ultrasound discussed; fetal survey: ordered. ? Follow up in 4 weeks. ?50% of 30 min visit spent on counseling and coordination of care.  ?  ? ?07/01/2006 ?09/01/2021 ? ? ?

## 2021-09-01 NOTE — Progress Notes (Signed)
Patient reports sharp pelvic pain when she moves around. Denies any vaginal bleeding.  ?

## 2021-09-02 LAB — CERVICOVAGINAL ANCILLARY ONLY
Bacterial Vaginitis (gardnerella): POSITIVE — AB
Candida Glabrata: NEGATIVE
Candida Vaginitis: NEGATIVE
Chlamydia: NEGATIVE
Comment: NEGATIVE
Comment: NEGATIVE
Comment: NEGATIVE
Comment: NEGATIVE
Comment: NEGATIVE
Comment: NORMAL
Neisseria Gonorrhea: NEGATIVE
Trichomonas: POSITIVE — AB

## 2021-09-03 ENCOUNTER — Telehealth: Payer: Self-pay | Admitting: Family Medicine

## 2021-09-03 DIAGNOSIS — B9689 Other specified bacterial agents as the cause of diseases classified elsewhere: Secondary | ICD-10-CM

## 2021-09-03 DIAGNOSIS — A599 Trichomoniasis, unspecified: Secondary | ICD-10-CM

## 2021-09-03 LAB — AFP, SERUM, OPEN SPINA BIFIDA
AFP MoM: 0.82
AFP Value: 29.1 ng/mL
Gest. Age on Collection Date: 17.2 weeks
Maternal Age At EDD: 31.7 yr
OSBR Risk 1 IN: 10000
Test Results:: NEGATIVE
Weight: 215 [lb_av]

## 2021-09-03 LAB — HEMOGLOBIN A1C
Est. average glucose Bld gHb Est-mCnc: 97 mg/dL
Hgb A1c MFr Bld: 5 % (ref 4.8–5.6)

## 2021-09-03 LAB — CBC/D/PLT+RPR+RH+ABO+RUBIGG...
Antibody Screen: NEGATIVE
Basophils Absolute: 0 10*3/uL (ref 0.0–0.2)
Basos: 0 %
EOS (ABSOLUTE): 0.1 10*3/uL (ref 0.0–0.4)
Eos: 1 %
HCV Ab: NONREACTIVE
HIV Screen 4th Generation wRfx: NONREACTIVE
Hematocrit: 37.7 % (ref 34.0–46.6)
Hemoglobin: 13.1 g/dL (ref 11.1–15.9)
Hepatitis B Surface Ag: NEGATIVE
Immature Grans (Abs): 0 10*3/uL (ref 0.0–0.1)
Immature Granulocytes: 0 %
Lymphocytes Absolute: 1.8 10*3/uL (ref 0.7–3.1)
Lymphs: 23 %
MCH: 33.1 pg — ABNORMAL HIGH (ref 26.6–33.0)
MCHC: 34.7 g/dL (ref 31.5–35.7)
MCV: 95 fL (ref 79–97)
Monocytes Absolute: 0.4 10*3/uL (ref 0.1–0.9)
Monocytes: 5 %
Neutrophils Absolute: 5.6 10*3/uL (ref 1.4–7.0)
Neutrophils: 71 %
Platelets: 251 10*3/uL (ref 150–450)
RBC: 3.96 x10E6/uL (ref 3.77–5.28)
RDW: 12.4 % (ref 11.7–15.4)
RPR Ser Ql: NONREACTIVE
Rh Factor: POSITIVE
Rubella Antibodies, IGG: 1.44 index (ref >0.99)
WBC: 8 10*3/uL (ref 3.4–10.8)

## 2021-09-03 LAB — CYTOLOGY - PAP
Comment: NEGATIVE
Diagnosis: NEGATIVE
High risk HPV: NEGATIVE

## 2021-09-03 LAB — URINE CULTURE, OB REFLEX

## 2021-09-03 LAB — CULTURE, OB URINE

## 2021-09-03 LAB — HCV INTERPRETATION

## 2021-09-03 MED ORDER — METRONIDAZOLE 500 MG PO TABS: 500.0000 mg | ORAL_TABLET | Freq: Two times a day (BID) | ORAL | 0 refills | Status: DC

## 2021-09-03 NOTE — Telephone Encounter (Signed)
Patient would like to review recent results. Reviewed positive Trichomonas and BV. Reviewed treatment per protocol. Explained pt will need to notify partner of need to be treated.  ?

## 2021-09-03 NOTE — Telephone Encounter (Signed)
Patient need someone to  explain to her about the two STD's she have ?

## 2021-09-04 ENCOUNTER — Other Ambulatory Visit: Payer: Self-pay | Admitting: Obstetrics & Gynecology

## 2021-09-13 ENCOUNTER — Ambulatory Visit: Payer: Medicaid Other

## 2021-09-15 ENCOUNTER — Encounter: Payer: Self-pay | Admitting: *Deleted

## 2021-09-21 ENCOUNTER — Telehealth: Payer: Self-pay | Admitting: General Practice

## 2021-09-21 NOTE — Telephone Encounter (Signed)
Patient called and left message on nurse voicemail line stating she hasn't received her test results for the gender & would like a call back.  Called patient and reviewed where to find test results in her chart & informed patient of results. Patient verbalized understanding.

## 2021-09-30 ENCOUNTER — Encounter: Payer: Medicaid Other | Admitting: Family Medicine

## 2021-09-30 ENCOUNTER — Encounter: Payer: Self-pay | Admitting: Family Medicine

## 2021-09-30 DIAGNOSIS — Z419 Encounter for procedure for purposes other than remedying health state, unspecified: Secondary | ICD-10-CM | POA: Diagnosis not present

## 2021-09-30 NOTE — Progress Notes (Signed)
Patient did not keep appointment today. She will be called to reschedule.  

## 2021-10-01 ENCOUNTER — Ambulatory Visit: Payer: Medicaid Other

## 2021-10-01 ENCOUNTER — Ambulatory Visit: Payer: Medicaid Other | Attending: Family Medicine

## 2021-10-30 DIAGNOSIS — Z419 Encounter for procedure for purposes other than remedying health state, unspecified: Secondary | ICD-10-CM | POA: Diagnosis not present

## 2021-11-05 ENCOUNTER — Other Ambulatory Visit: Payer: Self-pay | Admitting: *Deleted

## 2021-11-05 DIAGNOSIS — Z3480 Encounter for supervision of other normal pregnancy, unspecified trimester: Secondary | ICD-10-CM

## 2021-11-08 ENCOUNTER — Encounter: Payer: Medicaid Other | Admitting: Family Medicine

## 2021-11-08 ENCOUNTER — Other Ambulatory Visit: Payer: Medicaid Other

## 2021-11-08 DIAGNOSIS — Z3480 Encounter for supervision of other normal pregnancy, unspecified trimester: Secondary | ICD-10-CM

## 2021-11-08 DIAGNOSIS — B009 Herpesviral infection, unspecified: Secondary | ICD-10-CM

## 2021-11-08 DIAGNOSIS — Z3A27 27 weeks gestation of pregnancy: Secondary | ICD-10-CM

## 2021-11-09 ENCOUNTER — Ambulatory Visit (HOSPITAL_BASED_OUTPATIENT_CLINIC_OR_DEPARTMENT_OTHER): Payer: Medicaid Other

## 2021-11-09 ENCOUNTER — Ambulatory Visit: Payer: Medicaid Other | Attending: Family Medicine | Admitting: *Deleted

## 2021-11-09 VITALS — BP 119/58 | HR 72

## 2021-11-09 DIAGNOSIS — O99212 Obesity complicating pregnancy, second trimester: Secondary | ICD-10-CM

## 2021-11-09 DIAGNOSIS — Z3480 Encounter for supervision of other normal pregnancy, unspecified trimester: Secondary | ICD-10-CM | POA: Diagnosis not present

## 2021-11-09 DIAGNOSIS — Z3A27 27 weeks gestation of pregnancy: Secondary | ICD-10-CM

## 2021-11-09 DIAGNOSIS — Z3492 Encounter for supervision of normal pregnancy, unspecified, second trimester: Secondary | ICD-10-CM | POA: Insufficient documentation

## 2021-11-09 DIAGNOSIS — O0932 Supervision of pregnancy with insufficient antenatal care, second trimester: Secondary | ICD-10-CM | POA: Diagnosis not present

## 2021-11-10 ENCOUNTER — Other Ambulatory Visit: Payer: Self-pay | Admitting: *Deleted

## 2021-11-10 ENCOUNTER — Telehealth: Payer: Self-pay

## 2021-11-10 DIAGNOSIS — Z362 Encounter for other antenatal screening follow-up: Secondary | ICD-10-CM

## 2021-11-10 NOTE — Telephone Encounter (Addendum)
-----   Message from Reva Bores, MD sent at 11/09/2021  4:25 PM EDT ----- F/u anatomy u/s-->please schedule in 4-6 wks.  Per chart review pt has been already been scheduled on 12/06/21 @ 1515 by MFM  [facility] 11/10/21

## 2021-11-12 ENCOUNTER — Encounter: Payer: Medicaid Other | Admitting: Obstetrics and Gynecology

## 2021-11-12 ENCOUNTER — Other Ambulatory Visit: Payer: Medicaid Other

## 2021-11-30 DIAGNOSIS — Z419 Encounter for procedure for purposes other than remedying health state, unspecified: Secondary | ICD-10-CM | POA: Diagnosis not present

## 2021-12-03 ENCOUNTER — Encounter: Payer: Medicaid Other | Admitting: Advanced Practice Midwife

## 2021-12-06 ENCOUNTER — Other Ambulatory Visit: Payer: Self-pay | Admitting: Maternal & Fetal Medicine

## 2021-12-06 ENCOUNTER — Telehealth: Payer: Self-pay | Admitting: Family Medicine

## 2021-12-06 ENCOUNTER — Encounter: Payer: Self-pay | Admitting: *Deleted

## 2021-12-06 ENCOUNTER — Ambulatory Visit: Payer: Medicaid Other | Admitting: *Deleted

## 2021-12-06 ENCOUNTER — Ambulatory Visit: Payer: Medicaid Other | Attending: Maternal & Fetal Medicine

## 2021-12-06 ENCOUNTER — Ambulatory Visit (HOSPITAL_BASED_OUTPATIENT_CLINIC_OR_DEPARTMENT_OTHER): Payer: Medicaid Other | Admitting: *Deleted

## 2021-12-06 VITALS — BP 123/58 | HR 69

## 2021-12-06 DIAGNOSIS — O0933 Supervision of pregnancy with insufficient antenatal care, third trimester: Secondary | ICD-10-CM | POA: Diagnosis not present

## 2021-12-06 DIAGNOSIS — Z3480 Encounter for supervision of other normal pregnancy, unspecified trimester: Secondary | ICD-10-CM | POA: Insufficient documentation

## 2021-12-06 DIAGNOSIS — Z3A31 31 weeks gestation of pregnancy: Secondary | ICD-10-CM

## 2021-12-06 DIAGNOSIS — O365930 Maternal care for other known or suspected poor fetal growth, third trimester, not applicable or unspecified: Secondary | ICD-10-CM | POA: Diagnosis not present

## 2021-12-06 DIAGNOSIS — O99213 Obesity complicating pregnancy, third trimester: Secondary | ICD-10-CM | POA: Insufficient documentation

## 2021-12-06 DIAGNOSIS — E669 Obesity, unspecified: Secondary | ICD-10-CM | POA: Diagnosis not present

## 2021-12-06 DIAGNOSIS — Z362 Encounter for other antenatal screening follow-up: Secondary | ICD-10-CM

## 2021-12-06 NOTE — Telephone Encounter (Signed)
Patient stated [facility] told her she needs her prenatal prescriptions sent in as well to them before they will fill her medications.

## 2021-12-06 NOTE — Procedures (Signed)
Makayla Kramer 1990-06-26 [redacted]w[redacted]d  Fetus A Non-Stress Test Interpretation for 12/06/21  Indication: IUGR  Fetal Heart Rate A Mode: External Baseline Rate (A): 130 bpm Variability: Moderate Accelerations: 10 x 10 Decelerations: None Multiple birth?: No  Uterine Activity Mode: Palpation, Toco Contraction Frequency (min): none Resting Tone Palpated: Relaxed  Interpretation (Fetal Testing) Nonstress Test Interpretation: Reactive Overall Impression: Reassuring for gestational age Comments: Dr. Parke Poisson reviewed tracing

## 2021-12-07 ENCOUNTER — Ambulatory Visit (INDEPENDENT_AMBULATORY_CARE_PROVIDER_SITE_OTHER): Payer: Medicaid Other | Admitting: Family Medicine

## 2021-12-07 ENCOUNTER — Other Ambulatory Visit: Payer: Self-pay | Admitting: *Deleted

## 2021-12-07 VITALS — BP 121/77 | HR 101

## 2021-12-07 DIAGNOSIS — O0993 Supervision of high risk pregnancy, unspecified, third trimester: Secondary | ICD-10-CM

## 2021-12-07 DIAGNOSIS — O99343 Other mental disorders complicating pregnancy, third trimester: Secondary | ICD-10-CM | POA: Diagnosis not present

## 2021-12-07 DIAGNOSIS — F1910 Other psychoactive substance abuse, uncomplicated: Secondary | ICD-10-CM

## 2021-12-07 DIAGNOSIS — Z3A31 31 weeks gestation of pregnancy: Secondary | ICD-10-CM

## 2021-12-07 DIAGNOSIS — O99340 Other mental disorders complicating pregnancy, unspecified trimester: Secondary | ICD-10-CM

## 2021-12-07 DIAGNOSIS — B009 Herpesviral infection, unspecified: Secondary | ICD-10-CM

## 2021-12-07 DIAGNOSIS — O0990 Supervision of high risk pregnancy, unspecified, unspecified trimester: Secondary | ICD-10-CM

## 2021-12-07 DIAGNOSIS — F32A Depression, unspecified: Secondary | ICD-10-CM

## 2021-12-07 DIAGNOSIS — O0933 Supervision of pregnancy with insufficient antenatal care, third trimester: Secondary | ICD-10-CM | POA: Diagnosis not present

## 2021-12-07 DIAGNOSIS — O365930 Maternal care for other known or suspected poor fetal growth, third trimester, not applicable or unspecified: Secondary | ICD-10-CM

## 2021-12-07 DIAGNOSIS — Z3480 Encounter for supervision of other normal pregnancy, unspecified trimester: Secondary | ICD-10-CM
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
## 2021-12-07 NOTE — Telephone Encounter (Signed)
Called pt who states she is having issue getting prenatal, was told that a new prescription was needed. Called pharmacy who states refills are available and they will prepare for pick-up.
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
## 2021-12-08 LAB — HEMOGLOBIN A1C
Est. average glucose Bld gHb Est-mCnc: 97 mg/dL
Hgb A1c MFr Bld: 5 % (ref 4.8–5.6)

## 2021-12-08 NOTE — BH Specialist Note (Signed)
Integrated Behavioral Health via Telemedicine Visit  12/22/2021 [name] [id number]  Number of Integrated Behavioral Health Clinician visits: 1- Initial Visit  Session Start time: 0825   Session End time: 0853  Total time in minutes: 28   Referring Provider: Marcheta Grammes, MD Patient/Family location: Home [facility] Provider location: Center for [facility] at [facility]  All persons participating in visit: Patient Makayla Kramer and [facility] Merl Guardino   Types of Service: Individual psychotherapy and Video visit  I connected with Terea C Bordley and/or Hadlyn C Larmon's  n/a  via  Telephone or Engineer, civil (consulting)  (Video is Caregility application) and verified that I am speaking with the correct person using two identifiers. Discussed confidentiality: Yes   I discussed the limitations of telemedicine and the availability of in person appointments.  Discussed there is a possibility of technology failure and discussed alternative modes of communication if that failure occurs.  I discussed that engaging in this telemedicine visit, they consent to the provision of behavioral healthcare and the services will be billed under their insurance.  Patient and/or legal guardian expressed understanding and consented to Telemedicine visit: Yes   Presenting Concerns: Patient and/or family reports the following symptoms/concerns: Depressed, irritable, crying, "really emotional", anxiety with panic attacks, life stress; prefers no medication to treat bipolar disorder, but open to implementing self-coping strategy to help manage emotions, in the midst of current life stress.Pt is attending [facility] for outpatient services.  Duration of problem: Ongoing ; Severity of problem:  moderately severe  Patient and/or Family's Strengths/Protective Factors: Concrete supports in place (healthy food, safe environments, etc.) and Sense of purpose  Goals  Addressed: Patient will:  Reduce symptoms of: anxiety, depression, mood instability, and stress   Increase knowledge and/or ability of: healthy habits and self-management skills   Demonstrate ability to: Increase healthy adjustment to current life circumstances and Increase adequate support systems for patient/family  Progress towards Goals: Ongoing  Interventions: Interventions utilized:  Mindfulness or Management consultant, Psychoeducation and/or Health Education, and Link to [employer] Standardized Assessments completed: Not Needed  Patient and/or Family Response: Patient agrees with treatment plan.   Assessment: Patient currently experiencing Bipolar affective disorder, current episode depressed; Psychosocial stress  Patient may benefit from psychoeducation and brief therapeutic interventions regarding coping with symptoms of depression, anxiety, mood instability, life stress .  Plan: Follow up with behavioral health clinician on : Postpartum mood check; Call Olympia Adelsberger at [phone], as needed. Behavioral recommendations:  -Continue attending [facility] outpatient services --Continue prioritizing healthy self-care (regular meals, adequate rest; allowing practical help from supportive friends and family as needed) -Consider new mom support group as needed at either [email or web address].net or [email or web address]   -CALM relaxation breathing exercise twice daily (morning; at bedtime ); as needed throughout the day.   Referral(s): Integrated Art gallery manager (In Clinic) and [employer]:  perinatal support groups  I discussed the assessment and treatment plan with the patient and/or parent/guardian. They were provided an opportunity to ask questions and all were answered. They agreed with the plan and demonstrated an understanding of the instructions.   They were advised to call back or seek an in-person evaluation if the symptoms worsen or if the condition fails  to improve as anticipated.  [name], LCSW     12/07/2021    5:22 PM  Depression screen PHQ 2/9  Decreased Interest 2  Down, Depressed, Hopeless 3  PHQ - 2 Score 5  Altered sleeping 2  Tired, decreased energy 2  Change in appetite 2  Feeling bad or failure about yourself  3  Trouble concentrating 1  Moving slowly or fidgety/restless 2  Suicidal thoughts 0  PHQ-9 Score 17      12/07/2021    5:22 PM  GAD 7 : Generalized Anxiety Score  Nervous, Anxious, on Edge 2  Control/stop worrying 2  Worry too much - different things 3  Trouble relaxing 2  Restless 2  Easily annoyed or irritable 3  Afraid - awful might happen 2  Total GAD 7 Score 16

## 2021-12-08 NOTE — Addendum Note (Signed)
Addended by: Guy Begin on: 12/08/2021 09:37 AM   Modules accepted: Orders

## 2021-12-13 ENCOUNTER — Ambulatory Visit: Payer: Medicaid Other

## 2021-12-14 ENCOUNTER — Ambulatory Visit: Payer: Medicaid Other | Attending: Obstetrics

## 2021-12-14 ENCOUNTER — Other Ambulatory Visit: Payer: Medicaid Other

## 2021-12-14 ENCOUNTER — Ambulatory Visit: Payer: Medicaid Other

## 2021-12-22 ENCOUNTER — Ambulatory Visit (INDEPENDENT_AMBULATORY_CARE_PROVIDER_SITE_OTHER): Payer: Medicaid Other | Admitting: Clinical

## 2021-12-22 ENCOUNTER — Ambulatory Visit: Payer: Medicaid Other | Admitting: *Deleted

## 2021-12-22 ENCOUNTER — Ambulatory Visit: Payer: Medicaid Other | Attending: Obstetrics

## 2021-12-22 ENCOUNTER — Other Ambulatory Visit: Payer: Medicaid Other

## 2021-12-22 VITALS — BP 112/68 | HR 96

## 2021-12-22 DIAGNOSIS — F3130 Bipolar disorder, current episode depressed, mild or moderate severity, unspecified: Secondary | ICD-10-CM | POA: Diagnosis not present

## 2021-12-22 DIAGNOSIS — O0933 Supervision of pregnancy with insufficient antenatal care, third trimester: Secondary | ICD-10-CM | POA: Insufficient documentation

## 2021-12-22 DIAGNOSIS — O99213 Obesity complicating pregnancy, third trimester: Secondary | ICD-10-CM | POA: Diagnosis not present

## 2021-12-22 DIAGNOSIS — Z658 Other specified problems related to psychosocial circumstances: Secondary | ICD-10-CM

## 2021-12-22 DIAGNOSIS — Z3A33 33 weeks gestation of pregnancy: Secondary | ICD-10-CM | POA: Insufficient documentation

## 2021-12-22 DIAGNOSIS — Z3480 Encounter for supervision of other normal pregnancy, unspecified trimester: Secondary | ICD-10-CM

## 2021-12-22 DIAGNOSIS — O365930 Maternal care for other known or suspected poor fetal growth, third trimester, not applicable or unspecified: Secondary | ICD-10-CM | POA: Insufficient documentation

## 2021-12-22 NOTE — Patient Instructions (Signed)
Center for [facility] [place],  [address] [phone] (main office) [phone] [facility] office)  New Parent Support Groups www.postpartum.net www.conehealthybaby.com     BRAINSTORMING  Develop a Plan Goals: Provide a way to start conversation about your new life with a baby Assist parents in recognizing and using resources within their reach Help pave the way before birth for an easier period of transition afterwards.  Make a list of the following information to keep in a central location: Full name of Mom and Partner: _____________________________________________ 35 full name and Date of Birth: ___________________________________________ Home Address: ___________________________________________________________ ________________________________________________________________________ Home Phone: ____________________________________________________________ Parents' cell numbers: _____________________________________________________ ________________________________________________________________________ Name and contact info for OB: ______________________________________________ Name and contact info for Pediatrician:________________________________________ Contact info for Lactation Consultants: ________________________________________  REST and SLEEP *You each need at least 4-5 hours of uninterrupted sleep every day. Write specific names and contact information.* How are you going to rest in the postpartum period? While partner's home? When partner returns to work? When you both return to work? Where will your baby sleep? Who is available to help during the day? Evening? Night? Who could move in for a period to help support you? What are some ideas to help you get enough  sleep? __________________________________________________________________________________________________________________________________________________________________________________________________________________________________________ NUTRITIOUS FOOD AND DRINK *Plan for meals before your baby is born so you can have healthy food to eat during the immediate postpartum period.* Who will look after breakfast? Lunch? Dinner? List names and contact information. Brainstorm quick, healthy ideas for each meal. What can you do before baby is born to prepare meals for the postpartum period? How can others help you with meals? Which grocery stores provide online shopping and delivery? Which restaurants offer take-out or delivery options? ______________________________________________________________________________________________________________________________________________________________________________________________________________________________________________________________________________________________________________________________________________________________________________________________________  CARE FOR MOM *It's important that mom is cared for and pampered in the postpartum period. Remember, the most important ways new mothers need care are: sleep, nutrition, gentle exercise, and time off.* Who can come take care of mom during this period? Make a list of people with their contact information. List some activities that make you feel cared for, rested, and energized? Who can make sure you have opportunities to do these things? Does mom have a space of her very own within your home that's just for her? Make a "[facility]" where she can be comfortable, rest, and renew herself  daily. ______________________________________________________________________________________________________________________________________________________________________________________________________________________________________________________________________________________________________________________________________________________________________________________________________    CARE FOR AND FEEDING BABY *Knowledgeable and encouraging people will offer the best support with regard to feeding your baby.* Educate yourself and choose the best feeding option for your baby. Make a list of people who will guide, support, and be a resource for you as your care for and feed your baby. (Friends that have breastfed or are currently breastfeeding, lactation consultants, breastfeeding support groups, etc.) Consider a postpartum doula. (These websites can give you information: dona.org & BuyingShow.es) Seek out local breastfeeding resources like the breastfeeding support group at [employer] or [employer]. ______________________________________________________________________________________________________________________________________________________________________________________________________________________________________________________________________________________________________________________________________________________________________________________________________  Verner Chol AND ERRANDS Who can help with a thorough cleaning before baby is born? Make a list of people who will help with housekeeping and chores, like laundry, light cleaning, dishes, bathrooms, etc. Who can run some errands for you? What can you do to make sure you are stocked with basic supplies before baby is born? Who is going to do the  shopping? ______________________________________________________________________________________________________________________________________________________________________________________________________________________________________________________________________________________________________________________________________________________________________________________________________     Family Adjustment *Nurture yourselves.it helps parents be more loving and allows for better bonding with their child.* What sorts  of things do you and partner enjoy doing together? Which activities help you to connect and strengthen your relationship? Make a list of those things. Make a list of people whom you trust to care for your baby so you can have some time together as a couple. What types of things help partner feel connected to Mom? Make a list. What needs will partner have in order to bond with baby? Other children? Who will care for them when you go into labor and while you are in the hospital? Think about what the needs of your older children might be. Who can help you meet those needs? In what ways are you helping them prepare for bringing baby home? List some specific strategies you have for family adjustment. _______________________________________________________________________________________________________________________________________________________________________________________________________________________________________________________________________________________________________________________________________________  SUPPORT *Someone who can empathize with experiences normalizes your problems and makes them more bearable.* Make a list of other friends, neighbors, and/or co-workers you know with infants (and small children, if applicable) with whom you can connect. Make a list of local or online support groups, mom groups, etc. in which you can be  involved. ______________________________________________________________________________________________________________________________________________________________________________________________________________________________________________________________________________________________________________________________________________________________________________________________________  Childcare Plans Investigate and plan for childcare if mom is returning to work. Talk about mom's concerns about her transition back to work. Talk about partner's concerns regarding this transition.  Mental Health *Your mental health is one of the highest priorities for a pregnant or postpartum mom.* 1 in 5 women experience anxiety and/or depression from the time of conception through the first year after birth. Postpartum Mood Disorders are the #1 complication of pregnancy and childbirth and the suffering experienced by these mothers is not necessary! These illnesses are temporary and respond well to treatment, which often includes self-care, social support, talk therapy, and medication when needed. Women experiencing anxiety and depression often say things like: "I'm supposed to be happy.why do I feel so sad?", "Why can't I snap out of it?", "I'm having thoughts that scare me." There is no need to be embarrassed if you are feeling these symptoms: Overwhelmed, anxious, angry, sad, guilty, irritable, hopeless, exhausted but can't sleep You are NOT alone. You are NOT to blame. With help, you WILL be well. Where can I find help? Medical professionals such as your OB, midwife, gynecologist, family practitioner, primary care provider, pediatrician, or mental health providers; [facility] support groups: Feelings After Birth, Breastfeeding Support Group, Baby and Me Group, and Fit 4 Two exercise classes. You have permission to ask for help. It will confirm your feelings, validate your experiences,  share/learn coping strategies, and gain support and encouragement as you heal. You are important! BRAINSTORM Make a list of local resources, including resources for mom and for partner. Identify support groups. Identify people to call late at night - include names and contact info. Talk with partner about perinatal mood and anxiety disorders. Talk with your OB, midwife, and doula about baby blues and about perinatal mood and anxiety disorders. Talk with your pediatrician about perinatal mood and anxiety disorders.   Support & Sanity Savers   What do you really need?  Basics In preparing for a new baby, many expectant parents spend hours shopping for baby clothes, decorating the nursery, and deciding which car seat to buy. Yet most don't think much about what the reality of parenting a newborn will be like, and what they need to make it through that. So, here is the advice of experienced parents. We know you'll read this, and think "they're exaggerating, I don't really need that." Just trust [place]  on these, OK? Plan for all of this, and if it turns out you don't need it, come back and teach [place] how you did it!  Must-Haves (Once baby's survival needs are met, make sure you attend to your own survival needs!) Sleep An average newborn sleeps 16-18 hours per day, over 6-7 sleep periods, rarely more than three hours at a time. It is normal and healthy for a newborn to wake throughout the night... but really hard on parents!! Naps. Prioritize sleep above any responsibilities like: cleaning house, visiting friends, running errands, etc.  Sleep whenever baby sleeps. If you can't nap, at least have restful times when baby eats. The more rest you get, the more patient you will be, the more emotionally stable, and better at solving problems.  Food You may not have realized it would be difficult to eat when you have a newborn. Yet, when we talk to countless new parents, they say things like "it may be 2:00 pm  when I realize I haven't had breakfast yet." Or "every time we sit down to dinner, baby needs to eat, and my food gets cold, so I don't bother to eat it." Finger food. Before your baby is born, stock up with one months' worth of food that: 1) you can eat with one hand while holding a baby, 2) doesn't need to be prepped, 3) is good hot or cold, 4) doesn't spoil when left out for a few hours, and 5) you like to eat. Think about: nuts, dried fruit, Clif bars, pretzels, jerky, gogurt, baby carrots, apples, bananas, crackers, cheez-n-crackers, string cheese, hot pockets or frozen burritos to microwave, garden burgers and breakfast pastries to put in the toaster, yogurt drinks, etc. Restaurant Menus. Make lists of your favorite restaurants & menu items. When family/friends want to help, you can give specific information without much thought. They can either bring you the food or send gift cards for just the right meals. Freezer Meals.  Take some time to make a few meals to put in the freezer ahead of time.  Easy to freeze meals can be anything such as soup, lasagna, chicken pie, or spaghetti sauce. Set up a Meal Schedule.  Ask friends and family to sign up to bring you meals during the first few weeks of being home. (It can be passed around at baby showers!) You have no idea how helpful this will be until you are in the throes of parenting.  https://hamilton-woodard.[email or web address]/ is a great website to check out. Emotional Support Know who to call when you're stressed out. Parenting a newborn is very challenging work. There are times when it totally overwhelms your normal coping abilities. EVERY NEW PARENT NEEDS TO HAVE A PLAN FOR WHO TO CALL WHEN THEY JUST CAN'T COPE ANY MORE. (And it has to be someone other than the baby's other parent!) Before your baby is born, come up with at least one person you can call for support - write their phone number down and post it on the refrigerator. Anxiety & Sadness. Baby blues are normal after  pregnancy; however, there are more severe types of anxiety & sadness which can occur and should not be ignored.  They are always treatable, but you have to take the first step by reaching out for help. [facility] offers a "Mom Talk" group which meets every Tuesday from 10 am - 11 am.  This group is for new moms who need support and connection after their babies are born.  Call [phone].  Really, Really Helpful (Plan for them! Make sure these happen often!!) Physical Support with Taking Care of Yourselves Asking friends and family. Before your baby is born, set up a schedule of people who can come and visit and help out (or ask a friend to schedule for you). Any time someone says "let me know what I can do to help," sign them up for a day. When they get there, their job is not to take care of the baby (that's your job and your joy). Their job is to take care of you!  Postpartum doulas. If you don't have anyone you can call on for support, look into postpartum doulas:  professionals at helping parents with caring for baby, caring for themselves, getting breastfeeding started, and helping with household tasks. www.padanc.org is a helpful website for learning about doulas in our area. Peer Support / Parent Groups Why: One of the greatest ideas for new parents is to be around other new parents. Parent groups give you a chance to share and listen to others who are going through the same season of life, get a sense of what is normal infant development by watching several babies learn and grow, share your stories of triumph and struggles with empathetic ears, and forgive your own mistakes when you realize all parents are learning by trial and error. Where to find: There are many places you can meet other new parents throughout our community.  [facility] offers the following classes for new moms and their little ones:  Baby and Me (Birth to [place]) and Breastfeeding Support Group. Go to  www.conehealthybaby.com or call [phone] for more information. Time for your Relationship It's easy to get so caught up in meeting baby's immediate needs that it's hard to find time to connect with your partner, and meet the needs of your relationship. It's also easy to forget what "quality time with your partner" actually looks like. If you take your baby on a date, you'd be amazed how much of your couple time is spent feeding the baby, diapering the baby, admiring the baby, and talking about the baby. Dating: Try to take time for just the two of you. Babysitter tip: Sometimes when moms are breastfeeding a newborn, they find it hard to figure out how to schedule outings around baby's unpredictable feeding schedules. Have the babysitter come for a three hour period. When she comes over, if baby has just eaten, you can leave right away, and come back in two hours. If baby hasn't fed recently, you start the date at home. Once baby gets hungry and gets a good feeding in, you can head out for the rest of your date time. Date Nights at Home: If you can't get out, at least set aside one evening a week to prioritize your relationship: whenever baby dozes off or doesn't have any immediate needs, spend a little time focusing on each other. Potential conflicts: The main relationship conflicts that come up for new parents are: issues related to sexuality, financial stresses, a feeling of an unfair division of household tasks, and conflicts in parenting styles. The more you can work on these issues before baby arrives, the better!  Fun and Frills (Don't forget these. and don't feel guilty for indulging in them!) Everyone has something in life that is a fun little treat that they do just for themselves. It may be: reading the morning paper, or going for a daily jog, or having coffee with a friend once a week, or going  to a movie on Friday nights, or fine chocolates, or bubble baths, or curling up with a good  book. Unless you do fun things for yourself every now and then, it's hard to have the energy for fun with your baby. Whatever your "special" treats are, make sure you find a way to continue to indulge in them after your baby is born. These special moments can recharge you, and allow you to return to baby with a new joy   PERINATAL MOOD DISORDERS: [place]   _________________________________________Emergency and Crisis Resources If you are an imminent risk to self or others, are experiencing intense personal distress, and/or have noticed significant changes in activities of daily living, call:  [place]: [phone]  [address] [place], [place], [address] Mobile Crisis: [place]: [phone] Or visit the following crisis centers: Local Emergency Departments [facility]: [address], [place]. Hours: 8:30AM-5PM. Insurance Accepted: Medicaid, Medicare, and Uninsured.  [facility]:  [address], [place]  Mon-Friday 8am-3pm, [phone]                                                                                  ___________ Non-Crisis Resources To identify specific providers that are covered by your insurance, contact your insurance company or local agencies:  [place]: [phone] [facility] [employer]: [place]: [phone] Postpartum Support International- Warm-line: 705-307-5010                                                      __Outpatient Therapy and Medication Management   Providers:  [facility]: [phone] Hours: 9AM-5PM  Insurance Accepted: Alben Spittle, Shane Crutch, [place], [place] [facility]): [phone] Hours: 8AM-5:30PM  nsurance Accepted: All insurances EXCEPT AARP, [place],  [place], and [place]: [phone] Hours: 8AM-8PM Insurance Accepted: Cristal Ford, Freddrick March, [place], Medicare, [phone] [facility]: [phone] Hours: 8:30AM-7PM Insurance Accepted: Cristal Ford, Medicaid, Medicare, Tricare, [employer] Counseling:  [place] Accepted:  Holland Falling, Lorella Nimrod, [employer], [place]: [phone] Hours: 9AM-5:30PM Insurance Accepted: Alben Spittle, Charlotte Crumb, and Medicaid, Medicare, [facility]:  [phone] Hours: 9am-5pm Insurance Accepted: BCBS; they do not accept Medicaid/Medicare [facility] [place]: [phone] Hours: 9am-9pm Insurance Accepted: All major insurance including Medicaid and Medicare [facility]: [phone] Hours: [place] Accepted: All insurances EXCEPT Medicaid and Medicare. [facility]: [phone]   ____________  Parenting Support Groups [facility]: [phone] [facility]:  [phone] Family Support Network: (support for children in the NICU and/or with special needs), [phone]   ___________                                                                 Mental Health Support Groups Mental Health Association: [phone]    _____________                                                                                  Online Resources Postpartum Support International: http://www.postpartum.net/  800-944-4PPD 2Moms Supporting Moms:  www.momssupportingmoms.net    

## 2021-12-23 ENCOUNTER — Telehealth: Payer: Self-pay

## 2021-12-23 ENCOUNTER — Other Ambulatory Visit: Payer: Self-pay | Admitting: *Deleted

## 2021-12-23 DIAGNOSIS — O365990 Maternal care for other known or suspected poor fetal growth, unspecified trimester, not applicable or unspecified: Secondary | ICD-10-CM

## 2021-12-23 NOTE — Telephone Encounter (Signed)
Call patient, no answer, left message to return call back in regards to appointment scheduled for 12/28/2021.

## 2021-12-27 ENCOUNTER — Encounter: Payer: Medicaid Other | Admitting: Family Medicine

## 2021-12-28 ENCOUNTER — Ambulatory Visit: Payer: Medicaid Other

## 2021-12-28 ENCOUNTER — Ambulatory Visit: Payer: Medicaid Other | Attending: Obstetrics

## 2021-12-31 DIAGNOSIS — Z419 Encounter for procedure for purposes other than remedying health state, unspecified: Secondary | ICD-10-CM | POA: Diagnosis not present

## 2022-01-05 ENCOUNTER — Ambulatory Visit: Payer: Medicaid Other

## 2022-01-05 ENCOUNTER — Ambulatory Visit: Payer: Medicaid Other | Attending: Obstetrics

## 2022-01-06 ENCOUNTER — Telehealth: Payer: Self-pay

## 2022-01-06 ENCOUNTER — Telehealth: Payer: Self-pay | Admitting: Family Medicine

## 2022-01-06 NOTE — Telephone Encounter (Signed)
Patient is requesting a call, state she need a car seat.

## 2022-01-06 NOTE — Telephone Encounter (Signed)
Patient called to reschedule missed appointment for UA Dopplers and NST on Wed. 01/05/22 - offered only slot available for Thursday 01/06/22 - patient requested Friday - nothing available - patient will come to Monday 's 9/11 ultrasound appointment after [facility] appt.

## 2022-01-10 ENCOUNTER — Other Ambulatory Visit: Payer: Self-pay | Admitting: Obstetrics

## 2022-01-10 ENCOUNTER — Ambulatory Visit: Payer: Medicaid Other | Admitting: *Deleted

## 2022-01-10 ENCOUNTER — Ambulatory Visit: Payer: Medicaid Other | Attending: Obstetrics

## 2022-01-10 ENCOUNTER — Encounter: Payer: Medicaid Other | Admitting: Family Medicine

## 2022-01-10 VITALS — BP 130/58 | HR 75

## 2022-01-10 DIAGNOSIS — O365931 Maternal care for other known or suspected poor fetal growth, third trimester, fetus 1: Secondary | ICD-10-CM

## 2022-01-10 DIAGNOSIS — E669 Obesity, unspecified: Secondary | ICD-10-CM | POA: Diagnosis not present

## 2022-01-10 DIAGNOSIS — O99213 Obesity complicating pregnancy, third trimester: Secondary | ICD-10-CM | POA: Diagnosis not present

## 2022-01-10 DIAGNOSIS — O0933 Supervision of pregnancy with insufficient antenatal care, third trimester: Secondary | ICD-10-CM | POA: Insufficient documentation

## 2022-01-10 DIAGNOSIS — O365930 Maternal care for other known or suspected poor fetal growth, third trimester, not applicable or unspecified: Secondary | ICD-10-CM

## 2022-01-10 DIAGNOSIS — Z3480 Encounter for supervision of other normal pregnancy, unspecified trimester: Secondary | ICD-10-CM

## 2022-01-10 DIAGNOSIS — Z3A36 36 weeks gestation of pregnancy: Secondary | ICD-10-CM | POA: Diagnosis not present

## 2022-01-10 NOTE — Procedures (Signed)
Makayla Kramer [birth year] [redacted]w[redacted]d  Fetus A Non-Stress Test Interpretation for 01/10/22  Indication: IUGR and obese  Fetal Heart Rate A Mode: External Baseline Rate (A): 135 bpm Variability: Moderate Accelerations: 15 x 15 Decelerations: Variable Multiple birth?: No  Uterine Activity Mode: Toco Contraction Frequency (min): none Resting Tone Palpated: Relaxed  Interpretation (Fetal Testing) Nonstress Test Interpretation: Reactive Overall Impression: Reassuring for gestational age Comments: tracing reviewed by Dr. Judeth Cornfield

## 2022-01-11 ENCOUNTER — Other Ambulatory Visit: Payer: Self-pay | Admitting: *Deleted

## 2022-01-11 DIAGNOSIS — O99213 Obesity complicating pregnancy, third trimester: Secondary | ICD-10-CM

## 2022-01-11 DIAGNOSIS — O365931 Maternal care for other known or suspected poor fetal growth, third trimester, fetus 1: Secondary | ICD-10-CM

## 2022-01-14 DIAGNOSIS — F1910 Other psychoactive substance abuse, uncomplicated: Secondary | ICD-10-CM | POA: Insufficient documentation

## 2022-01-14 DIAGNOSIS — O0930 Supervision of pregnancy with insufficient antenatal care, unspecified trimester: Secondary | ICD-10-CM | POA: Insufficient documentation

## 2022-01-14 NOTE — Progress Notes (Unsigned)
   PRENATAL VISIT NOTE  Subjective:  Makayla Kramer is a 31 y.o. G3P1011 at [redacted]w[redacted]d being seen today for ongoing prenatal care.  She is currently monitored for the following issues for this high-risk pregnancy and has Herpes simplex virus (HSV) infection; Condyloma acuminatum; OBESITY, NOS; BIPOLAR DISORDER; Supervision of other normal pregnancy, antepartum; Polysubstance abuse (HCC); and Limited prenatal care on their problem list.  Patient reports no complaints.  Contractions: Irritability. Vag. Bleeding: None.  Movement: Present. Denies leaking of fluid.   The following portions of the patient's history were reviewed and updated as appropriate: allergies, current medications, past family history, past medical history, past social history, past surgical history and problem list.   Objective:   Vitals:   01/17/22 1616  BP: 112/69  Pulse: (!) 106    Fetal Status: Fetal Heart Rate (bpm): 130   Movement: Present     General:  Alert, oriented and cooperative. Patient is in no acute distress.  Skin: Skin is warm and dry. No rash noted.   Cardiovascular: Normal heart rate noted  Respiratory: Normal respiratory effort, no problems with respiration noted  Abdomen: Soft, gravid, appropriate for gestational age.  Pain/Pressure: Present     Pelvic: Cervical exam performed in the presence of a chaperone        Extremities: Normal range of motion.  Edema: None  Mental Status: Normal mood and affect. Normal behavior. Normal judgment and thought content.   Assessment and Plan:  Pregnancy: G3P1011 at [redacted]w[redacted]d 1. Supervision of other normal pregnancy, antepartum GBS done today Recommended 1 hr asap. A1C last time was 5.0 and normal Discussed need for blood work today - RPR, HIV, CBC.  Offered and recommended flu shot - pt declines  Has not had tdap - would offer in the hospital now that 37w.  Discussed MOF: Bottlefeed Discussed contraception: Undecided, but will not want IUD.   2. Herpes  simplex virus (HSV) infection Valtrex sent to pharmacy. Reviewed risk of outbreak in third trimester and resulting need for 1LTCS.   3. Polysubstance abuse (HCC) Self reported cocaine and MJ at last appt. Today reports last use of MJ use was yesterday. Denies recent cocaine use.   4. Limited prenatal care, antepartum  5. Fetal growth restriction - Had [place] on 9/11 and was 6%ile with normal dopplers. AC was 3.7%ile. Cephalic. Scheduled for IOL on 9/26.    Preterm labor symptoms and general obstetric precautions including but not limited to vaginal bleeding, contractions, leaking of fluid and fetal movement were reviewed in detail with the patient. Please refer to After Visit Summary for other counseling recommendations.   Return in about 1 week (around 01/24/2022) for OB VISIT, MD or APP.  Future Appointments  Date Time Provider Department Center  01/18/2022 12:30 PM [facility] NURSE [facility]  01/18/2022 12:45 PM [facility] US4 [facility]  01/18/2022  2:05 PM [facility] NST [facility]  01/24/2022  4:15 PM Berle Mull [facility]  01/31/2022  3:55 PM Hermina Staggers, MD [facility]  02/07/2022  3:55 PM Autry-Lott, Randa Evens, DO [facility]  02/14/2022  3:15 PM [facility] NST [facility]  02/14/2022  4:15 PM Autry-Lott, Randa Evens, DO [facility]  02/22/2022  1:15 PM [facility]    Milas Hock, MD

## 2022-01-17 ENCOUNTER — Other Ambulatory Visit (HOSPITAL_COMMUNITY)
Admission: RE | Admit: 2022-01-17 | Discharge: 2022-01-17 | Disposition: A | Discharge status: Home / Self Care | Payer: Medicaid Other | Source: Ambulatory Visit | Attending: Obstetrics and Gynecology | Admitting: Obstetrics and Gynecology

## 2022-01-17 ENCOUNTER — Other Ambulatory Visit: Payer: Self-pay

## 2022-01-17 ENCOUNTER — Encounter: Payer: Self-pay | Admitting: Obstetrics and Gynecology

## 2022-01-17 ENCOUNTER — Ambulatory Visit (INDEPENDENT_AMBULATORY_CARE_PROVIDER_SITE_OTHER): Payer: Medicaid Other | Admitting: Obstetrics and Gynecology

## 2022-01-17 VITALS — BP 112/69 | HR 106 | Wt 227.3 lb

## 2022-01-17 DIAGNOSIS — Z3483 Encounter for supervision of other normal pregnancy, third trimester: Secondary | ICD-10-CM | POA: Diagnosis not present

## 2022-01-17 DIAGNOSIS — Z3480 Encounter for supervision of other normal pregnancy, unspecified trimester: Secondary | ICD-10-CM | POA: Diagnosis not present

## 2022-01-17 DIAGNOSIS — O0933 Supervision of pregnancy with insufficient antenatal care, third trimester: Secondary | ICD-10-CM | POA: Diagnosis not present

## 2022-01-17 DIAGNOSIS — F1910 Other psychoactive substance abuse, uncomplicated: Secondary | ICD-10-CM | POA: Diagnosis not present

## 2022-01-17 DIAGNOSIS — O0930 Supervision of pregnancy with insufficient antenatal care, unspecified trimester: Secondary | ICD-10-CM

## 2022-01-17 DIAGNOSIS — B009 Herpesviral infection, unspecified: Secondary | ICD-10-CM

## 2022-01-17 DIAGNOSIS — Z3A37 37 weeks gestation of pregnancy: Secondary | ICD-10-CM

## 2022-01-17 MED ORDER — VALACYCLOVIR HCL 500 MG PO TABS: 500.0000 mg | ORAL_TABLET | Freq: Two times a day (BID) | ORAL | 0 refills | Status: DC

## 2022-01-17 NOTE — Progress Notes (Signed)
Jamie notified

## 2022-01-17 NOTE — Patient Instructions (Signed)
Congratulations, you're on your way to having your baby!!!   You now have an induction scheduled.   If your induction is scheduled for midnight, you will arrive at 11:45pm on the date provided on the form form the clinical staff today. You will arrive at the [facility] (Entrance C) and tell them you are there for your induction. The hospital will only call you to not come if they have NO beds available and need to postpone your admission time.    If your induction is scheduled for the daytime, you will see an appointment time in MyChart. Please DO NOT show up at this time, this is just a placeholder on the schedule. You will get a call when your room is ready and will have 2 hours to arrive. The hospital staff can call anytime starting at 5 am through the rest of the day.   You will also get a call from the pre-admission nurse to go over pre-admission screen about 2 days prior to your induction date.      

## 2022-01-18 ENCOUNTER — Encounter (HOSPITAL_COMMUNITY): Payer: Self-pay | Admitting: *Deleted

## 2022-01-18 ENCOUNTER — Ambulatory Visit: Payer: Medicaid Other | Attending: Obstetrics and Gynecology

## 2022-01-18 ENCOUNTER — Telehealth (HOSPITAL_COMMUNITY): Payer: Self-pay | Admitting: *Deleted

## 2022-01-18 ENCOUNTER — Ambulatory Visit: Payer: Medicaid Other

## 2022-01-18 ENCOUNTER — Other Ambulatory Visit: Payer: Self-pay

## 2022-01-18 DIAGNOSIS — Z3480 Encounter for supervision of other normal pregnancy, unspecified trimester: Secondary | ICD-10-CM

## 2022-01-18 LAB — CERVICOVAGINAL ANCILLARY ONLY
Chlamydia: NEGATIVE
Comment: NEGATIVE
Comment: NORMAL
Neisseria Gonorrhea: NEGATIVE

## 2022-01-18 NOTE — Telephone Encounter (Signed)
Preadmission screen  

## 2022-01-19 ENCOUNTER — Other Ambulatory Visit: Payer: Self-pay | Admitting: Advanced Practice Midwife

## 2022-01-19 ENCOUNTER — Other Ambulatory Visit: Payer: Medicaid Other

## 2022-01-19 DIAGNOSIS — O365990 Maternal care for other known or suspected poor fetal growth, unspecified trimester, not applicable or unspecified: Secondary | ICD-10-CM

## 2022-01-21 LAB — CULTURE, BETA STREP (GROUP B ONLY): Strep Gp B Culture: NEGATIVE

## 2022-01-25 ENCOUNTER — Inpatient Hospital Stay (HOSPITAL_COMMUNITY): Payer: Medicaid Other

## 2022-01-26 ENCOUNTER — Other Ambulatory Visit: Payer: Self-pay

## 2022-01-26 ENCOUNTER — Inpatient Hospital Stay (HOSPITAL_COMMUNITY): Payer: Medicaid Other | Admitting: Anesthesiology

## 2022-01-26 ENCOUNTER — Inpatient Hospital Stay (HOSPITAL_COMMUNITY)
Admission: AD | Admit: 2022-01-26 | Discharge: 2022-01-30 | DRG: 787 | Disposition: A | Discharge status: Home / Self Care | Payer: Medicaid Other | Attending: Obstetrics and Gynecology | Admitting: Obstetrics and Gynecology

## 2022-01-26 ENCOUNTER — Encounter (HOSPITAL_COMMUNITY)
Admission: AD | Disposition: A | Discharge status: Home / Self Care | Payer: Self-pay | Source: Home / Self Care | Attending: Obstetrics and Gynecology

## 2022-01-26 ENCOUNTER — Encounter (HOSPITAL_COMMUNITY): Payer: Self-pay | Admitting: Family Medicine

## 2022-01-26 DIAGNOSIS — O99324 Drug use complicating childbirth: Secondary | ICD-10-CM | POA: Diagnosis not present

## 2022-01-26 DIAGNOSIS — F1490 Cocaine use, unspecified, uncomplicated: Secondary | ICD-10-CM | POA: Diagnosis not present

## 2022-01-26 DIAGNOSIS — O0930 Supervision of pregnancy with insufficient antenatal care, unspecified trimester: Secondary | ICD-10-CM

## 2022-01-26 DIAGNOSIS — O365930 Maternal care for other known or suspected poor fetal growth, third trimester, not applicable or unspecified: Principal | ICD-10-CM | POA: Diagnosis present

## 2022-01-26 DIAGNOSIS — O99334 Smoking (tobacco) complicating childbirth: Secondary | ICD-10-CM | POA: Diagnosis present

## 2022-01-26 DIAGNOSIS — Z3A38 38 weeks gestation of pregnancy: Secondary | ICD-10-CM | POA: Diagnosis not present

## 2022-01-26 DIAGNOSIS — F1290 Cannabis use, unspecified, uncomplicated: Secondary | ICD-10-CM | POA: Diagnosis not present

## 2022-01-26 DIAGNOSIS — F17210 Nicotine dependence, cigarettes, uncomplicated: Secondary | ICD-10-CM | POA: Diagnosis present

## 2022-01-26 DIAGNOSIS — Z419 Encounter for procedure for purposes other than remedying health state, unspecified: Secondary | ICD-10-CM | POA: Diagnosis not present

## 2022-01-26 DIAGNOSIS — O9832 Other infections with a predominantly sexual mode of transmission complicating childbirth: Secondary | ICD-10-CM | POA: Diagnosis not present

## 2022-01-26 DIAGNOSIS — A6000 Herpesviral infection of urogenital system, unspecified: Secondary | ICD-10-CM | POA: Diagnosis not present

## 2022-01-26 DIAGNOSIS — Z3480 Encounter for supervision of other normal pregnancy, unspecified trimester: Secondary | ICD-10-CM

## 2022-01-26 DIAGNOSIS — O9902 Anemia complicating childbirth: Secondary | ICD-10-CM | POA: Diagnosis present

## 2022-01-26 DIAGNOSIS — O365990 Maternal care for other known or suspected poor fetal growth, unspecified trimester, not applicable or unspecified: Secondary | ICD-10-CM | POA: Diagnosis present

## 2022-01-26 DIAGNOSIS — F319 Bipolar disorder, unspecified: Secondary | ICD-10-CM | POA: Diagnosis present

## 2022-01-26 DIAGNOSIS — B009 Herpesviral infection, unspecified: Secondary | ICD-10-CM | POA: Diagnosis present

## 2022-01-26 LAB — CBC
HCT: 34.7 % — ABNORMAL LOW (ref 36.0–46.0)
Hemoglobin: 12 g/dL (ref 12.0–15.0)
MCH: 32.3 pg (ref 26.0–34.0)
MCHC: 34.6 g/dL (ref 30.0–36.0)
MCV: 93.5 fL (ref 80.0–100.0)
Platelets: 255 10*3/uL (ref 150–400)
RBC: 3.71 MIL/uL — ABNORMAL LOW (ref 3.87–5.11)
RDW: 13 % (ref 11.5–15.5)
WBC: 8.6 10*3/uL (ref 4.0–10.5)
nRBC: 0 % (ref 0.0–0.2)

## 2022-01-26 LAB — TYPE AND SCREEN
ABO/RH(D): O POS
Antibody Screen: NEGATIVE

## 2022-01-26 LAB — RPR: RPR Ser Ql: NONREACTIVE

## 2022-01-26 SURGERY — Surgical Case
Anesthesia: Epidural

## 2022-01-26 MED ORDER — SODIUM CHLORIDE 0.9 % IV SOLN
INTRAVENOUS | Status: DC | PRN
  Administered 2022-01-26: 500 mg via INTRAVENOUS

## 2022-01-26 MED ORDER — FENTANYL CITRATE (PF) 100 MCG/2ML IJ SOLN
INTRAMUSCULAR | Status: DC | PRN
  Administered 2022-01-26: 100 ug via EPIDURAL

## 2022-01-26 MED ORDER — PHENYLEPHRINE 80 MCG/ML (10ML) SYRINGE FOR IV PUSH (FOR BLOOD PRESSURE SUPPORT)
80.0000 ug | PREFILLED_SYRINGE | INTRAVENOUS | Status: DC | PRN
  Administered 2022-01-26: 160 ug via INTRAVENOUS

## 2022-01-26 MED ORDER — EPHEDRINE 5 MG/ML INJ
5.0000 mg | INTRAVENOUS | Status: DC | PRN
  Administered 2022-01-26: 5 mg via INTRAVENOUS

## 2022-01-26 MED ORDER — SODIUM CHLORIDE 0.9% FLUSH: 3.0000 mL | INTRAVENOUS | Status: DC | PRN

## 2022-01-26 MED ORDER — LIDOCAINE HCL (PF) 1 % IJ SOLN: 30.0000 mL | INTRAMUSCULAR | Status: DC | PRN

## 2022-01-26 MED ORDER — ONDANSETRON HCL 4 MG/2ML IJ SOLN
INTRAMUSCULAR | Status: DC | PRN
  Administered 2022-01-26: 4 mg via INTRAVENOUS

## 2022-01-26 MED ORDER — PROMETHAZINE HCL 25 MG/ML IJ SOLN: 6.2500 mg | INTRAMUSCULAR | Status: DC | PRN

## 2022-01-26 MED ORDER — EPHEDRINE 5 MG/ML INJ
INTRAVENOUS | Status: AC
  Filled 2022-01-26: qty 5

## 2022-01-26 MED ORDER — OXYTOCIN-SODIUM CHLORIDE 30-0.9 UT/500ML-% IV SOLN
1.0000 m[IU]/min | INTRAVENOUS | Status: DC
  Administered 2022-01-26: 2 m[IU]/min via INTRAVENOUS

## 2022-01-26 MED ORDER — FENTANYL-BUPIVACAINE-NACL 0.5-0.125-0.9 MG/250ML-% EP SOLN
12.0000 mL/h | EPIDURAL | Status: DC | PRN
  Administered 2022-01-26: 12 mL/h via EPIDURAL
  Filled 2022-01-26: qty 250

## 2022-01-26 MED ORDER — ONDANSETRON HCL 4 MG/2ML IJ SOLN
4.0000 mg | Freq: Four times a day (QID) | INTRAMUSCULAR | Status: DC | PRN
  Administered 2022-01-26: 4 mg via INTRAVENOUS
  Filled 2022-01-26: qty 2

## 2022-01-26 MED ORDER — SODIUM BICARBONATE 8.4 % IV SOLN: INTRAVENOUS | Status: DC | PRN

## 2022-01-26 MED ORDER — SOD CITRATE-CITRIC ACID 500-334 MG/5ML PO SOLN
30.0000 mL | ORAL | Status: DC | PRN
  Administered 2022-01-26: 30 mL via ORAL
  Filled 2022-01-26: qty 30

## 2022-01-26 MED ORDER — DIPHENHYDRAMINE HCL 25 MG PO CAPS: 25.0000 mg | ORAL_CAPSULE | ORAL | Status: DC | PRN

## 2022-01-26 MED ORDER — EPHEDRINE 5 MG/ML INJ: 10.0000 mg | INTRAVENOUS | Status: DC | PRN

## 2022-01-26 MED ORDER — SCOPOLAMINE 1 MG/3DAYS TD PT72: 1.0000 | MEDICATED_PATCH | Freq: Once | TRANSDERMAL | Status: DC

## 2022-01-26 MED ORDER — KETOROLAC TROMETHAMINE 30 MG/ML IJ SOLN: 30.0000 mg | Freq: Four times a day (QID) | INTRAMUSCULAR | Status: DC | PRN

## 2022-01-26 MED ORDER — TERBUTALINE SULFATE 1 MG/ML IJ SOLN
0.2500 mg | Freq: Once | INTRAMUSCULAR | Status: AC | PRN
  Administered 2022-01-26: 0.25 mg via SUBCUTANEOUS
  Filled 2022-01-26: qty 1

## 2022-01-26 MED ORDER — KETOROLAC TROMETHAMINE 30 MG/ML IJ SOLN
30.0000 mg | Freq: Four times a day (QID) | INTRAMUSCULAR | Status: DC | PRN
  Administered 2022-01-27: 30 mg via INTRAVENOUS

## 2022-01-26 MED ORDER — FENTANYL CITRATE (PF) 100 MCG/2ML IJ SOLN
100.0000 ug | INTRAMUSCULAR | Status: DC | PRN
  Administered 2022-01-26 (×5): 100 ug via INTRAVENOUS
  Filled 2022-01-26 (×6): qty 2

## 2022-01-26 MED ORDER — DIPHENHYDRAMINE HCL 50 MG/ML IJ SOLN: 12.5000 mg | INTRAMUSCULAR | Status: DC | PRN

## 2022-01-26 MED ORDER — ACETAMINOPHEN 10 MG/ML IV SOLN: 1000.0000 mg | Freq: Once | INTRAVENOUS | Status: DC | PRN

## 2022-01-26 MED ORDER — OXYCODONE HCL 5 MG PO TABS: 5.0000 mg | ORAL_TABLET | Freq: Once | ORAL | Status: DC | PRN

## 2022-01-26 MED ORDER — PROPOFOL 10 MG/ML IV BOLUS
INTRAVENOUS | Status: DC | PRN
  Administered 2022-01-26: 100 mg via INTRAVENOUS
  Administered 2022-01-26: 200 mg via INTRAVENOUS

## 2022-01-26 MED ORDER — OXYTOCIN BOLUS FROM INFUSION: 333.0000 mL | Freq: Once | INTRAVENOUS | Status: DC

## 2022-01-26 MED ORDER — OXYTOCIN-SODIUM CHLORIDE 30-0.9 UT/500ML-% IV SOLN
2.5000 [IU]/h | INTRAVENOUS | Status: DC
  Administered 2022-01-26: 30 [IU] via INTRAVENOUS
  Filled 2022-01-26: qty 500

## 2022-01-26 MED ORDER — NALOXONE HCL 4 MG/10ML IJ SOLN: 1.0000 ug/kg/h | INTRAVENOUS | Status: DC | PRN

## 2022-01-26 MED ORDER — SODIUM CHLORIDE 0.9 % IR SOLN
Status: DC | PRN
  Administered 2022-01-26: 1

## 2022-01-26 MED ORDER — NALOXONE HCL 0.4 MG/ML IJ SOLN: 0.4000 mg | INTRAMUSCULAR | Status: DC | PRN

## 2022-01-26 MED ORDER — MORPHINE SULFATE (PF) 0.5 MG/ML IJ SOLN
INTRAMUSCULAR | Status: DC | PRN
  Administered 2022-01-26: 3 mg via EPIDURAL

## 2022-01-26 MED ORDER — SUCCINYLCHOLINE CHLORIDE 200 MG/10ML IV SOSY
PREFILLED_SYRINGE | INTRAVENOUS | Status: DC | PRN
  Administered 2022-01-26: 100 mg via INTRAVENOUS

## 2022-01-26 MED ORDER — SODIUM CHLORIDE 0.9 % IV SOLN: INTRAVENOUS | Status: DC | PRN

## 2022-01-26 MED ORDER — SCOPOLAMINE 1 MG/3DAYS TD PT72
MEDICATED_PATCH | TRANSDERMAL | Status: DC | PRN
  Administered 2022-01-26: 1 via TRANSDERMAL

## 2022-01-26 MED ORDER — LACTATED RINGERS IV SOLN: INTRAVENOUS | Status: DC

## 2022-01-26 MED ORDER — ACETAMINOPHEN 10 MG/ML IV SOLN
INTRAVENOUS | Status: DC | PRN
  Administered 2022-01-26: 1000 mg via INTRAVENOUS

## 2022-01-26 MED ORDER — SODIUM CHLORIDE 0.9 % IV SOLN
INTRAVENOUS | Status: AC
  Filled 2022-01-26: qty 5

## 2022-01-26 MED ORDER — MEPERIDINE HCL 25 MG/ML IJ SOLN: 6.2500 mg | INTRAMUSCULAR | Status: DC | PRN

## 2022-01-26 MED ORDER — HYDROMORPHONE HCL 1 MG/ML IJ SOLN: 0.2500 mg | INTRAMUSCULAR | Status: DC | PRN

## 2022-01-26 MED ORDER — STERILE WATER FOR IRRIGATION IR SOLN
Status: DC | PRN
  Administered 2022-01-26: 1000 mL

## 2022-01-26 MED ORDER — LIDOCAINE-EPINEPHRINE (PF) 2 %-1:200000 IJ SOLN
INTRAMUSCULAR | Status: DC | PRN
  Administered 2022-01-26: 8 mL via EPIDURAL
  Administered 2022-01-26: 3 mL via EPIDURAL
  Administered 2022-01-26: 4 mL via EPIDURAL

## 2022-01-26 MED ORDER — LIDOCAINE HCL (PF) 1 % IJ SOLN
INTRAMUSCULAR | Status: DC | PRN
  Administered 2022-01-26: 4 mL via EPIDURAL
  Administered 2022-01-26: 6 mL via EPIDURAL

## 2022-01-26 MED ORDER — OXYCODONE-ACETAMINOPHEN 5-325 MG PO TABS: 2.0000 | ORAL_TABLET | ORAL | Status: DC | PRN

## 2022-01-26 MED ORDER — PHENYLEPHRINE 80 MCG/ML (10ML) SYRINGE FOR IV PUSH (FOR BLOOD PRESSURE SUPPORT): 80.0000 ug | PREFILLED_SYRINGE | INTRAVENOUS | Status: DC | PRN

## 2022-01-26 MED ORDER — FENTANYL CITRATE (PF) 100 MCG/2ML IJ SOLN
INTRAMUSCULAR | Status: AC
  Filled 2022-01-26: qty 2

## 2022-01-26 MED ORDER — ACETAMINOPHEN 325 MG PO TABS: 650.0000 mg | ORAL_TABLET | ORAL | Status: DC | PRN

## 2022-01-26 MED ORDER — MORPHINE SULFATE (PF) 0.5 MG/ML IJ SOLN
INTRAMUSCULAR | Status: AC
  Filled 2022-01-26: qty 10

## 2022-01-26 MED ORDER — MISOPROSTOL 25 MCG QUARTER TABLET
25.0000 ug | ORAL_TABLET | Freq: Once | ORAL | Status: AC
  Administered 2022-01-26: 25 ug via ORAL
  Filled 2022-01-26: qty 1

## 2022-01-26 MED ORDER — LACTATED RINGERS IV SOLN: 500.0000 mL | INTRAVENOUS | Status: DC | PRN

## 2022-01-26 MED ORDER — DIPHENHYDRAMINE HCL 50 MG/ML IJ SOLN
12.5000 mg | INTRAMUSCULAR | Status: DC | PRN
  Administered 2022-01-27: 12.5 mg via INTRAVENOUS
  Filled 2022-01-26: qty 1

## 2022-01-26 MED ORDER — MISOPROSTOL 25 MCG QUARTER TABLET
25.0000 ug | ORAL_TABLET | Freq: Once | ORAL | Status: AC
  Administered 2022-01-26: 25 ug via VAGINAL
  Filled 2022-01-26: qty 1

## 2022-01-26 MED ORDER — LACTATED RINGERS IV SOLN: 500.0000 mL | Freq: Once | INTRAVENOUS | Status: DC

## 2022-01-26 MED ORDER — OXYCODONE HCL 5 MG/5ML PO SOLN: 5.0000 mg | Freq: Once | ORAL | Status: DC | PRN

## 2022-01-26 MED ORDER — FENTANYL CITRATE (PF) 100 MCG/2ML IJ SOLN
INTRAMUSCULAR | Status: DC | PRN
  Administered 2022-01-26 (×2): 50 ug via INTRAVENOUS

## 2022-01-26 MED ORDER — OXYCODONE-ACETAMINOPHEN 5-325 MG PO TABS: 1.0000 | ORAL_TABLET | ORAL | Status: DC | PRN

## 2022-01-26 MED ORDER — ONDANSETRON HCL 4 MG/2ML IJ SOLN: 4.0000 mg | Freq: Three times a day (TID) | INTRAMUSCULAR | Status: DC | PRN

## 2022-01-26 MED ORDER — CEFAZOLIN SODIUM-DEXTROSE 2-3 GM-%(50ML) IV SOLR
INTRAVENOUS | Status: DC | PRN
  Administered 2022-01-26: 2 g via INTRAVENOUS

## 2022-01-26 MED ORDER — DEXAMETHASONE SODIUM PHOSPHATE 10 MG/ML IJ SOLN
INTRAMUSCULAR | Status: DC | PRN
  Administered 2022-01-26: 8 mg via INTRAVENOUS

## 2022-01-26 SURGICAL SUPPLY — 37 items
BENZOIN TINCTURE PRP APPL 2/3 (GAUZE/BANDAGES/DRESSINGS) ×1 IMPLANT
CHLORAPREP W/TINT 26ML (MISCELLANEOUS) ×2 IMPLANT
CLAMP UMBILICAL CORD (MISCELLANEOUS) ×1 IMPLANT
CLOTH BEACON ORANGE TIMEOUT ST (SAFETY) ×1 IMPLANT
DERMABOND ADVANCED .7 DNX12 (GAUZE/BANDAGES/DRESSINGS) ×1 IMPLANT
DERMABOND ADVANCED .7 DNX6 (GAUZE/BANDAGES/DRESSINGS) IMPLANT
DRSG OPSITE POSTOP 4X10 (GAUZE/BANDAGES/DRESSINGS) ×1 IMPLANT
ELECT REM PT RETURN 9FT ADLT (ELECTROSURGICAL) ×1
ELECTRODE REM PT RTRN 9FT ADLT (ELECTROSURGICAL) ×1 IMPLANT
EXTRACTOR VACUUM KIWI (MISCELLANEOUS) IMPLANT
GLOVE BIOGEL PI IND STRL 7.0 (GLOVE) ×3 IMPLANT
GLOVE ECLIPSE 6.5 STRL STRAW (GLOVE) ×1 IMPLANT
GOWN STRL REUS W/TWL LRG LVL3 (GOWN DISPOSABLE) ×3 IMPLANT
KIT ABG SYR 3ML LUER SLIP (SYRINGE) IMPLANT
NDL HYPO 25X5/8 SAFETYGLIDE (NEEDLE) IMPLANT
NEEDLE HYPO 25X5/8 SAFETYGLIDE (NEEDLE) IMPLANT
NS IRRIG 1000ML POUR BTL (IV SOLUTION) ×1 IMPLANT
PACK C SECTION WH (CUSTOM PROCEDURE TRAY) ×1 IMPLANT
PAD ABD 7.5X8 STRL (GAUZE/BANDAGES/DRESSINGS) ×1 IMPLANT
PAD OB MATERNITY 4.3X12.25 (PERSONAL CARE ITEMS) ×1 IMPLANT
RETRACTOR TRAXI PANNICULUS (MISCELLANEOUS) IMPLANT
RTRCTR C-SECT PINK 25CM LRG (MISCELLANEOUS) ×1 IMPLANT
SUT PLAIN 0 NONE (SUTURE) IMPLANT
SUT PLAIN 2 0 XLH (SUTURE) IMPLANT
SUT VIC AB 0 CT1 27 (SUTURE) ×2
SUT VIC AB 0 CT1 27XBRD ANBCTR (SUTURE) ×2 IMPLANT
SUT VIC AB 0 CTX 36 (SUTURE) ×3
SUT VIC AB 0 CTX36XBRD ANBCTRL (SUTURE) ×3 IMPLANT
SUT VIC AB 2-0 CT1 27 (SUTURE) ×1
SUT VIC AB 2-0 CT1 TAPERPNT 27 (SUTURE) ×1 IMPLANT
SUT VIC AB 3-0 SH 27 (SUTURE) ×1
SUT VIC AB 3-0 SH 27XBRD (SUTURE) ×1 IMPLANT
SUT VIC AB 4-0 KS 27 (SUTURE) ×1 IMPLANT
TOWEL OR 17X24 6PK STRL BLUE (TOWEL DISPOSABLE) ×1 IMPLANT
TRAXI PANNICULUS RETRACTOR (MISCELLANEOUS) ×1
TRAY FOLEY W/BAG SLVR 14FR LF (SET/KITS/TRAYS/PACK) IMPLANT
WATER STERILE IRR 1000ML POUR (IV SOLUTION) ×1 IMPLANT

## 2022-01-26 NOTE — Discharge Summary (Signed)
Postpartum Discharge Summary  Date of Service updated 01/29/22    Patient Name: Makayla Kramer DOB: 09/27/1990 MRN: [id number]  Date of admission: 01/26/2022 Delivery date:01/26/2022  Delivering provider: Janyth Pupa  Date of discharge: 01/29/2022  Admitting diagnosis: Pregnancy affected by fetal growth restriction [O36.5990] Intrauterine pregnancy: [redacted]w[redacted]d    Secondary diagnosis:  Principal Problem:   Cesarean delivery delivered Active Problems:   Herpes simplex virus (HSV) infection   Bipolar I disorder (HAlvin   Supervision of other normal pregnancy, antepartum   Limited prenatal care   Pregnancy affected by fetal growth restriction  Additional problems: N/a    Discharge diagnosis: Term Pregnancy Delivered                                              Post partum procedures: n/a Augmentation: AROM, Pitocin, Cytotec, and IP Foley Complications: None  Hospital course: Induction of Labor With Cesarean Section   31y.o. yo [id number] admitted to the hospital 01/26/2022 for induction of labor. Patient had a labor course significant for failed IOL. The patient went for cesarean section due to Non-Reassuring FHR. Delivery details are as follows: Membrane Rupture Time/Date: 2:20 PM ,01/26/2022   Delivery Method:C-Section, Low Transverse  Details of operation can be found in separate operative Note.  Patient had an uncomplicated postpartum course. She is ambulating, tolerating a regular diet, passing flatus, and urinating well.  Patient is discharged home in stable condition on 01/29/22.      Newborn Data: Birth date:01/26/2022  Birth time:10:15 PM  Gender:Female  Living status:Living  Apgars:8 ,9  Weight:2530 g                                Magnesium Sulfate received: No BMZ received: No Rhophylac:N/A MMR:N/A T-DaP: n/a Flu: N/A Transfusion:No  Physical exam  Vitals:   01/27/22 2048 01/28/22 0437 01/28/22 2006 01/29/22 0551  BP: 109/71 105/68 105/62 117/62   Pulse: 63 (!) 59 94 70  Resp: 16 18 18 18   Temp: 97.8 F (36.6 C) 98 F (36.7 C) 98.1 F (36.7 C) 98.2 F (36.8 C)  TempSrc: Oral Oral Oral Oral  SpO2: 100% 100% 100% 100%  Weight:      Height:       General: alert, cooperative, and no distress Lochia: appropriate Uterine Fundus: firm Incision: Dressing is clean, dry, and intact DVT Evaluation: No significant calf/ankle edema. Labs: Lab Results  Component Value Date   WBC 20.2 (H) 01/27/2022   HGB 11.3 (L) 01/27/2022   HCT 33.4 (L) 01/27/2022   MCV 96.0 01/27/2022   PLT 218 01/27/2022      Latest Ref Rng & Units 06/06/2020    1:47 AM  CMP  Glucose 70 - 99 mg/dL 121   BUN 6 - 20 mg/dL 13   Creatinine 0.44 - 1.00 mg/dL 1.07   Sodium 135 - 145 mmol/L 141   Potassium 3.5 - 5.1 mmol/L 3.2   Chloride 98 - 111 mmol/L 104   CO2 22 - 32 mmol/L 22   Calcium 8.9 - 10.3 mg/dL 9.5   Total Protein 6.5 - 8.1 g/dL 6.7   Total Bilirubin 0.3 - 1.2 mg/dL 1.0   Alkaline Phos 38 - 126 U/L 53   AST 15 - 41 U/L 14   ALT  0 - 44 U/L 10    Edinburgh Score:    01/27/2022    8:48 PM  Edinburgh Postnatal Depression Scale Screening Tool  I have been able to laugh and see the funny side of things. 1  I have looked forward with enjoyment to things. 2  I have blamed myself unnecessarily when things went wrong. 1  I have been anxious or worried for no good reason. 2  I have felt scared or panicky for no good reason. 2  Things have been getting on top of me. 2  I have been so unhappy that I have had difficulty sleeping. 2  I have felt sad or miserable. 2  I have been so unhappy that I have been crying. 3  The thought of harming myself has occurred to me. 1  Edinburgh Postnatal Depression Scale Total 18     After visit meds:  Allergies as of 01/29/2022   No Known Allergies      Medication List     STOP taking these medications    PrePLUS 27-1 MG Tabs   valACYclovir 500 MG tablet Commonly known as: Valtrex       TAKE these  medications    acetaminophen 325 MG tablet Commonly known as: TYLENOL Take 2 tablets (650 mg total) by mouth every 4 (four) hours as needed for mild pain (temperature > 101.5.).   ferrous sulfate 325 (65 FE) MG tablet Take 1 tablet (325 mg total) by mouth every other day.   gabapentin 100 MG capsule Commonly known as: Neurontin Take 1 capsule (100 mg total) by mouth 3 (three) times daily for 10 days.   ibuprofen 600 MG tablet Commonly known as: ADVIL Take 1 tablet (600 mg total) by mouth every 6 (six) hours.   oxyCODONE 5 MG immediate release tablet Commonly known as: Oxy IR/ROXICODONE Take 2 tablets (10 mg total) by mouth every 4 (four) hours as needed for up to 7 days for moderate pain.   senna-docusate 8.6-50 MG tablet Commonly known as: Senokot-S Take 3 tablets by mouth 2 (two) times daily.   simethicone 80 MG chewable tablet Commonly known as: MYLICON Chew 1 tablet (80 mg total) by mouth as needed for flatulence.   Slynd 4 MG Tabs Generic drug: Drospirenone Take 1 tablet by mouth daily for 28 days.               Discharge Care Instructions  (From admission, onward)           Start     Ordered   01/29/22 0000  Discharge wound care:       Comments: C-section wound care: You may feel pain/discomfort/burning sensation for several weeks. Keep the wound area clean by washing it with mild soap and water. You don't need to scrub it. Often, just letting the water run over your wound in the shower is enough. We do not recommend creams as this can cause infection, but we do recommend oral medication (prescribed), pressure dressings and running warm water on the wound to help ease discomfort/burning pain.   01/29/22 0804             Discharge home in stable condition Infant Feeding: Bottle and Breast Infant Disposition:home with mother Discharge instruction: per After Visit Summary and Postpartum booklet. Activity: Advance as tolerated. Pelvic rest for 6  weeks.  Diet: routine diet Future Appointments: Future Appointments  Date Time Provider [place]  02/03/2022  3:00 PM [facility] NURSE [facility]  02/22/2022  1:15 PM [place] [facility]  02/25/2022 10:35 AM Radene Gunning, MD [facility]   Follow up Visit: Message sent to [facility] 01/29/22  Please schedule this patient for a In person postpartum visit in 6 weeks with the following provider: Any provider. Additional Postpartum F/U:Incision check 1 week  Low risk pregnancy complicated by:  n/a Delivery mode:  C-Section, Low Transverse  Anticipated Birth Control:  Unsure   01/29/2022 Shelda Pal, DO

## 2022-01-26 NOTE — Anesthesia Preprocedure Evaluation (Signed)
Anesthesia Evaluation  Patient identified by MRN, date of birth, ID band Patient awake    Reviewed: Allergy & Precautions, H&P , NPO status , Patient's Chart, lab work & pertinent test results  History of Anesthesia Complications Negative for: history of anesthetic complications  Airway Mallampati: II  TM Distance: >3 FB     Dental   Pulmonary neg pulmonary ROS, Current Smoker,    Pulmonary exam normal        Cardiovascular negative cardio ROS   Rhythm:regular Rate:Normal     Neuro/Psych Anxiety Depression Bipolar Disorder negative neurological ROS  negative psych ROS   GI/Hepatic GERD  ,(+)     substance abuse  cocaine use and marijuana use,   Endo/Other  negative endocrine ROS  Renal/GU negative Renal ROS  negative genitourinary   Musculoskeletal   Abdominal   Peds  Hematology negative hematology ROS (+)   Anesthesia Other Findings   Reproductive/Obstetrics (+) Pregnancy                             Anesthesia Physical Anesthesia Plan  ASA: 2  Anesthesia Plan: Epidural   Post-op Pain Management:    Induction:   PONV Risk Score and Plan:   Airway Management Planned:   Additional Equipment:   Intra-op Plan:   Post-operative Plan:   Informed Consent: I have reviewed the patients History and Physical, chart, labs and discussed the procedure including the risks, benefits and alternatives for the proposed anesthesia with the patient or authorized representative who has indicated his/her understanding and acceptance.       Plan Discussed with:   Anesthesia Plan Comments:         Anesthesia Quick Evaluation

## 2022-01-26 NOTE — Progress Notes (Signed)
Patient ID: WALLIS SPIZZIRRI, female   DOB: 1990/11/08, 31 y.o.   MRN: [id number]  Labor Progress Note CAELYN ROUTE is a 31 y.o. G3P1011 at [redacted]w[redacted]d presented for IOL 2/2 FGR 6% S: Uncomfortable but breathing through contractions.   O:  BP (!) 111/59   Pulse 69   Temp (!) 97.5 F (36.4 C) (Oral)   Resp 18   Ht 5\' 11"  (1.803 m)   Wt 106.4 kg   SpO2 98%   BMI 32.72 kg/m  EFM: 110bpm/Mild variability/ 10x10 accels/ None decels   CVE: Dilation: Fingertip Effacement (%): 50 Station: -2 Presentation: Vertex Exam by:: Knute Neu, CNM   A&P: 31 y.o. [id number] [redacted]w[redacted]d IOL with FGR and SUD #Labor: Progressing well. Breathing working through contractions #Pain: IV pain meds, family support #FWB: CAT 1 #GBS negative #Cocaine + UDS: Unknown last use #HSV: Received Valtrex after 31 weeks   Deloria Lair, DO 10:54 AM

## 2022-01-26 NOTE — Progress Notes (Signed)
Patient ID: Makayla Kramer, female   DOB: 02-26-1991, 32 y.o.   MRN: [id number]  Labor Progress Note Makayla Kramer is a 31 y.o. G3P1011 at [redacted]w[redacted]d presented for IOL 2/2 FGR S:   O:  BP 119/66   Pulse (!) 56   Temp 98 F (36.7 C) (Oral)   Resp 18   Ht 5\' 11"  (1.803 m)   Wt 106.4 kg   SpO2 98%   BMI 32.72 kg/m  EFM: 120bpm/Mild variability/ 10x10 accels/ None decels   CVE: Dilation: 3.5 Effacement (%): 70 Station: -2 Presentation: Vertex Exam by:: Dr. Kerrie Pleasure   A&P: 31 y.o. G3P1011 [redacted]w[redacted]d IOL 2/2 FGR #Labor: S/p cytotec x3, AROM @1027 , Pitocin @ 8003, Recheck at 2020 #Pain: IV pain meds  #FWB: Cat 1 #GBS negative #FGR: Tolerating pitocin well #SUD: C/o cocaine use with + UDS. Monitoring.    Deloria Lair, DO 7:33 PM

## 2022-01-26 NOTE — Anesthesia Procedure Notes (Signed)
Epidural Patient location during procedure: OB Start time: 01/26/2022 7:57 PM End time: 01/26/2022 8:06 PM  Staffing Anesthesiologist: Lidia Collum, MD Performed: anesthesiologist   Preanesthetic Checklist Completed: patient identified, IV checked, risks and benefits discussed, monitors and equipment checked, pre-op evaluation and timeout performed  Epidural Patient position: sitting Prep: DuraPrep Patient monitoring: heart rate, continuous pulse ox and blood pressure Approach: midline Location: L3-L4 Injection technique: LOR air  Needle:  Needle type: Tuohy  Needle gauge: 17 G Needle length: 9 cm Needle insertion depth: 9 cm Catheter type: closed end flexible Catheter size: 19 Gauge Catheter at skin depth: 14 cm Test dose: negative  Assessment Events: blood not aspirated, injection not painful, no injection resistance, no paresthesia and negative IV test  Additional Notes Reason for block:procedure for pain

## 2022-01-26 NOTE — Op Note (Cosign Needed)
Makayla Kramer PROCEDURE DATE: 01/26/2022  PREOPERATIVE DIAGNOSES: Intrauterine pregnancy at [redacted]w[redacted]d weeks gestation; non-reassuring fetal status  POSTOPERATIVE DIAGNOSES: The same, viable infant delivered  PROCEDURE: PrimaryLow Transverse Cesarean Section  SURGEON:  Dr. Myna Hidalgo  ASSISTANT:  Myrtie Hawk, DO An experienced assistant was required given the standard of surgical care given the complexity of the case.  This assistant was needed for exposure, dissection, suctioning, retraction, instrument exchange, assisting with delivery with administration of fundal pressure, and for overall help during the procedure.  ANESTHESIOLOGY TEAM: Anesthesiologist: Lucretia Kern, MD  INDICATIONS: Makayla Kramer is a 31 y.o. [id number] at [redacted]w[redacted]d here for cesarean section secondary to the indications listed under preoperative diagnoses; please see preoperative note for further details.  The risks of surgery were discussed with the patient including but were not limited to: bleeding which may require transfusion or reoperation; infection which may require antibiotics; injury to bowel, bladder, ureters or other surrounding organs; injury to the fetus; need for additional procedures including hysterectomy in the event of a life-threatening hemorrhage; formation of adhesions; placental abnormalities wth subsequent pregnancies; incisional problems; thromboembolic phenomenon and other postoperative/anesthesia complications.  The patient concurred with the proposed Kramer, giving informed written consent for the procedure.    FINDINGS:  Viable female infant in cephalic presentation.  Apgars 8 and 9.  Amniotic fluid: clear.  Intact placenta, three vessel cord.  Normal uterus, fallopian tubes and ovaries bilaterally.  ANESTHESIA: general INTRAVENOUS FLUIDS: 1400 ml   ESTIMATED BLOOD LOSS: 860 ml URINE OUTPUT:  150 ml SPECIMENS: Placenta sent to pathology . COMPLICATIONS: None  immediate  PROCEDURE IN DETAIL:  The patient preoperatively received intravenous antibiotics and had sequential compression devices applied to her lower extremities.  She was then taken to the operating room where  spinal was dosed up to an appropriate level, however, due to patient intolerance of peritoneal manipulation, general anesthesia was administered . She was then placed in a dorsal supine position with a leftward tilt, and prepped and draped in a sterile manner.  A foley catheter was  was already in place.  After an adequate timeout was performed, a Pfannenstiel skin incision was made with scalpel and carried through to the underlying layer of fascia. The fascia was incised in the midline, and this incision was extended bluntly. The rectus muscles were separated in the midline and the peritoneum was entered bluntly.   The Alexis self-retaining retractor was introduced into the abdominal cavity.  Attention was turned to the lower uterine segment where a low transverse hysterotomy was made with a scalpel and extended bluntly in caudad and cephalad directions.  The infant was successfully delivered, the cord was clamped and cut after one minute, and the infant was handed over to the awaiting neonatology team. Uterine massage was then administered, and the placenta delivered intact with a three-vessel cord. The uterus was then cleared of clots and debris.  The hysterotomy was closed with 0-Vicryl in a running fashion. Interrupted horizontal mattress stitches with 0 Vicryl were placed to help with hemostasis.    The pelvis was cleared of all clot and debris. Hemostasis was confirmed on all surfaces. The uterus was once again inspected and found to be hemostatic. The retractor was removed.  The peritoneum was closed with a 2-0 Vicryl running stitch. The fascia was then closed using 0 Vicryl in a running fashion.  The subcutaneous layer was irrigated, any areas of bleeding were cauterized with the bovie,   was reapproximated with 2-0  plain gut in a running fashion, was found to be hemostatic.. The skin was closed with a 4-0 Vicryl subcuticular stitch. The patient tolerated the procedure well. Sponge, instrument and needle counts were correct x 3.  She was taken to the recovery room in stable condition.   Shelda Pal, [place] Fellow, Faculty practice [place] for [facility] 01/26/22  10:55 PM

## 2022-01-26 NOTE — Progress Notes (Signed)
Patient ID: Makayla Kramer, female   DOB: May 27, 1990, 31 y.o.   MRN: [id number]   CTSP at 2128 for late decels. Epidural had been placed at 2000 and a dose of ephedrine had been given; fluid bolus going. Arrived from next door and placed internal monitors after receiving consent from Makayla Kramer. Cx 3/70/vtx -2. First ctx after IUPC in place showed decel; Pitocin stopped and Terb given. Makayla Kramer moved side to side and then she got into hands and knees; late decels continued and Dr Nelda Marseille called for evaluation; she recommended a pLTCS due to nonreassuring FHR.  Myrtis Ser 01/26/2022

## 2022-01-26 NOTE — H&P (Signed)
Makayla Kramer is a 31 y.o. G34P1011 female at [redacted]w[redacted]d by 10wk u/s presenting for IOL d/t FGR.   Reports active fetal movement, contractions: irregular, vaginal bleeding: none, membranes: intact.  Initiated prenatal care at [facility] at 17 wks.   Most recent u/s 9/11 @ 36.0wks, EFW 6%/2255g w/ normal UAD.   This pregnancy complicated by: Limited pnc FGR Cocaine (reports last use 68mths ago) & THC use H/O HSV- just started suppression, reports no outbreak in years  Prenatal History/Complications:  VAVB 4174 SAB x 1  Past Medical History: Past Medical History:  Diagnosis Date   Anxiety    Depression    GERD (gastroesophageal reflux disease)    Gonorrhea    Headache    Herpes simplex    hsv (outbreak over a year ago)   Mental disorder    Syphilis    Trichomonas     Past Surgical History: Past Surgical History:  Procedure Laterality Date   TOOTH EXTRACTION      Obstetrical History: OB History     Gravida  3   Para  1   Term  1   Preterm  0   AB  1   Living  1      SAB  1   IAB  0   Ectopic  0   Multiple  0   Live Births  1           Social History: Social History   Socioeconomic History   Marital status: Single    Spouse name: Not on file   Number of children: Not on file   Years of education: Not on file   Highest education level: Not on file  Occupational History   Not on file  Tobacco Use   Smoking status: Every Day    Packs/day: 0.25    Years: 3.00    Total pack years: 0.75    Types: Cigarettes   Smokeless tobacco: Never  Vaping Use   Vaping Use: Never used  Substance and Sexual Activity   Alcohol use: Not Currently    Comment: not while preg   Drug use: Yes    Types: Marijuana, Cocaine    Comment: last use weed Dec 20, 2021   Sexual activity: Yes    Birth control/protection: None  Other Topics Concern   Not on file  Social History Narrative   Not on file   Social Determinants of Health   Financial Resource Strain: Not on  file  Food Insecurity: Food Insecurity Present (01/26/2022)   Hunger Vital Sign    Worried About Running Out of Food in the Last Year: Sometimes true    Ran Out of Food in the Last Year: Sometimes true  Transportation Needs: Unmet Transportation Needs (01/26/2022)   PRAPARE - Hydrologist (Medical): Yes    Lack of Transportation (Non-Medical): Yes  Physical Activity: Not on file  Stress: Not on file  Social Connections: Not on file    Family History: Family History  Problem Relation Age of Onset   Asthma Son    Cancer Maternal Grandmother    Diabetes Maternal Grandmother    Heart disease Neg Hx    Hypertension Neg Hx    Stroke Neg Hx     Allergies: No Known Allergies  Medications Prior to Admission  Medication Sig Dispense Refill Last Dose   Prenatal Vit-Fe Fumarate-FA (PREPLUS) 27-1 MG TABS Take 1 tablet by mouth daily. 30 tablet 11 01/25/2022  valACYclovir (VALTREX) 500 MG tablet Take 1 tablet (500 mg total) by mouth 2 (two) times daily. 30 tablet 0 01/25/2022    Review of Systems  Pertinent pos/neg as indicated in HPI  Blood pressure (!) 105/58, pulse 65, temperature 97.8 F (36.6 C), temperature source Oral, resp. rate 16, height 5\' 11"  (1.803 m), weight 106.4 kg, SpO2 98 %, unknown if currently breastfeeding. General appearance: alert, cooperative, and no distress Lungs: clear to auscultation bilaterally Heart: regular rate and rhythm Abdomen: gravid, soft, non-tender Extremities: no edema   Fetal monitoring: FHR: 120 bpm, variability: moderate,  Accelerations: Present,  decelerations:  prolonged @ 0352 when up to br Uterine activity: irregular  No lesions on exam Dilation: Fingertip Effacement (%): 50 Station: -2 Exam by:: [email or web address], CNM Presentation: cephalic, confirmed by informal TA u/s   Prenatal labs: ABO, Rh: --/--/O POS (09/27 0402) Antibody: NEG (09/27 0402) Rubella: 1.44 (05/03 1307) RPR: Non Reactive (05/03  1307)  HBsAg: Negative (05/03 1307)  HIV: Non Reactive (05/03 1307)  GBS: Negative/-- (09/18 1642)  2hr GTT: not done, A1C 5.0 on 8/8  Results for orders placed or performed during the hospital encounter of 01/26/22 (from the past 24 hour(s))  Type and screen   Collection Time: 01/26/22  4:02 AM  Result Value Ref Range   ABO/RH(D) O POS    Antibody Screen NEG    Sample Expiration      01/29/2022,2359 Performed at [facility], 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

## 2022-01-26 NOTE — Progress Notes (Signed)
Late entry for ~ 21:45pm.  At bedside for evaluation.  Pt noted to have recurrent late decels

## 2022-01-26 NOTE — Transfer of Care (Signed)
Immediate Anesthesia Transfer of Care Note  Patient: Makayla Kramer  Procedure(s) Performed: CESAREAN SECTION  Patient Location: PACU  Anesthesia Type:General  Level of Consciousness: awake, alert  and oriented  Airway & Oxygen Therapy: Patient Spontanous Breathing and Patient connected to nasal cannula oxygen  Post-op Assessment: Report given to RN and Post -op Vital signs reviewed and stable  Post vital signs: Reviewed and stable  Last Vitals:  Vitals Value Taken Time  BP 97/47 01/26/22 2330  Temp 35.8 C 01/26/22 2315  Pulse 86 01/26/22 2335  Resp 17 01/26/22 2335  SpO2 100 % 01/26/22 2335  Vitals shown include unvalidated device data.  Last Pain:  Vitals:   01/26/22 2315  TempSrc: Axillary  PainSc: 0-No pain         Complications: No notable events documented.

## 2022-01-26 NOTE — Anesthesia Procedure Notes (Signed)
Procedure Name: Intubation Date/Time: 01/26/2022 10:32 PM  Performed by: Genevie Ann, CRNAPre-anesthesia Checklist: Patient identified, Emergency Drugs available, Suction available, Patient being monitored and Timeout performed Oxygen Delivery Method: Circle system utilized Preoxygenation: Pre-oxygenation with 100% oxygen Induction Type: IV induction Laryngoscope Size: Mac, 3 and Glidescope Grade View: Grade I Tube type: Oral Tube size: 7.0 mm Number of attempts: 1 Airway Equipment and Method: Video-laryngoscopy Placement Confirmation: ETT inserted through vocal cords under direct vision Secured at: 22 cm Dental Injury: Teeth and Oropharynx as per pre-operative assessment

## 2022-01-27 ENCOUNTER — Other Ambulatory Visit: Payer: Self-pay | Admitting: Obstetrics and Gynecology

## 2022-01-27 ENCOUNTER — Encounter (HOSPITAL_COMMUNITY): Payer: Self-pay | Admitting: Obstetrics & Gynecology

## 2022-01-27 DIAGNOSIS — B009 Herpesviral infection, unspecified: Secondary | ICD-10-CM

## 2022-01-27 LAB — CBC
HCT: 33.4 % — ABNORMAL LOW (ref 36.0–46.0)
Hemoglobin: 11.3 g/dL — ABNORMAL LOW (ref 12.0–15.0)
MCH: 32.5 pg (ref 26.0–34.0)
MCHC: 33.8 g/dL (ref 30.0–36.0)
MCV: 96 fL (ref 80.0–100.0)
Platelets: 218 10*3/uL (ref 150–400)
RBC: 3.48 MIL/uL — ABNORMAL LOW (ref 3.87–5.11)
RDW: 13.2 % (ref 11.5–15.5)
WBC: 20.2 10*3/uL — ABNORMAL HIGH (ref 4.0–10.5)
nRBC: 0 % (ref 0.0–0.2)

## 2022-01-27 MED ORDER — SIMETHICONE 80 MG PO CHEW
80.0000 mg | CHEWABLE_TABLET | Freq: Three times a day (TID) | ORAL | Status: DC
  Administered 2022-01-27 – 2022-01-28 (×3): 80 mg via ORAL
  Filled 2022-01-27 (×3): qty 1

## 2022-01-27 MED ORDER — ENOXAPARIN SODIUM 40 MG/0.4ML IJ SOSY: 40.0000 mg | PREFILLED_SYRINGE | INTRAMUSCULAR | Status: DC

## 2022-01-27 MED ORDER — SIMETHICONE 80 MG PO CHEW
80.0000 mg | CHEWABLE_TABLET | ORAL | Status: DC | PRN
  Administered 2022-01-28: 80 mg via ORAL
  Filled 2022-01-27: qty 1

## 2022-01-27 MED ORDER — ENOXAPARIN SODIUM 60 MG/0.6ML IJ SOSY
50.0000 mg | PREFILLED_SYRINGE | INTRAMUSCULAR | Status: DC
  Administered 2022-01-27 – 2022-01-29 (×2): 50 mg via SUBCUTANEOUS
  Filled 2022-01-27 (×2): qty 0.6

## 2022-01-27 MED ORDER — IBUPROFEN 600 MG PO TABS
600.0000 mg | ORAL_TABLET | Freq: Four times a day (QID) | ORAL | Status: DC
  Administered 2022-01-28 – 2022-01-30 (×7): 600 mg via ORAL
  Filled 2022-01-27 (×7): qty 1

## 2022-01-27 MED ORDER — WITCH HAZEL-GLYCERIN EX PADS: 1.0000 | MEDICATED_PAD | CUTANEOUS | Status: DC | PRN

## 2022-01-27 MED ORDER — OXYTOCIN-SODIUM CHLORIDE 30-0.9 UT/500ML-% IV SOLN: 2.5000 [IU]/h | INTRAVENOUS | Status: AC

## 2022-01-27 MED ORDER — DIPHENHYDRAMINE HCL 25 MG PO CAPS: 25.0000 mg | ORAL_CAPSULE | Freq: Four times a day (QID) | ORAL | Status: DC | PRN

## 2022-01-27 MED ORDER — COCONUT OIL OIL: 1.0000 | TOPICAL_OIL | Status: DC | PRN

## 2022-01-27 MED ORDER — TETANUS-DIPHTH-ACELL PERTUSSIS 5-2.5-18.5 LF-MCG/0.5 IM SUSY: 0.5000 mL | PREFILLED_SYRINGE | Freq: Once | INTRAMUSCULAR | Status: DC

## 2022-01-27 MED ORDER — KETOROLAC TROMETHAMINE 30 MG/ML IJ SOLN
INTRAMUSCULAR | Status: AC
  Filled 2022-01-27: qty 1

## 2022-01-27 MED ORDER — LACTATED RINGERS IV SOLN: INTRAVENOUS | Status: DC

## 2022-01-27 MED ORDER — KETOROLAC TROMETHAMINE 30 MG/ML IJ SOLN
30.0000 mg | Freq: Four times a day (QID) | INTRAMUSCULAR | Status: AC
  Administered 2022-01-27 (×4): 30 mg via INTRAVENOUS
  Filled 2022-01-27 (×4): qty 1

## 2022-01-27 MED ORDER — MEASLES, MUMPS & RUBELLA VAC IJ SOLR: 0.5000 mL | Freq: Once | INTRAMUSCULAR | Status: DC

## 2022-01-27 MED ORDER — PRENATAL MULTIVITAMIN CH
1.0000 | ORAL_TABLET | Freq: Every day | ORAL | Status: DC
  Administered 2022-01-27 – 2022-01-29 (×3): 1 via ORAL
  Filled 2022-01-27 (×3): qty 1

## 2022-01-27 MED ORDER — SENNOSIDES-DOCUSATE SODIUM 8.6-50 MG PO TABS
2.0000 | ORAL_TABLET | Freq: Every day | ORAL | Status: DC
  Administered 2022-01-27: 2 via ORAL
  Filled 2022-01-27: qty 2

## 2022-01-27 MED ORDER — OXYCODONE HCL 5 MG PO TABS
5.0000 mg | ORAL_TABLET | ORAL | Status: DC | PRN
  Administered 2022-01-28 (×2): 10 mg via ORAL
  Administered 2022-01-28: 5 mg via ORAL
  Administered 2022-01-29 (×5): 10 mg via ORAL
  Filled 2022-01-27: qty 1
  Filled 2022-01-27 (×8): qty 2

## 2022-01-27 MED ORDER — MEDROXYPROGESTERONE ACETATE 150 MG/ML IM SUSP: 150.0000 mg | INTRAMUSCULAR | Status: DC | PRN

## 2022-01-27 MED ORDER — DIBUCAINE (PERIANAL) 1 % EX OINT: 1.0000 | TOPICAL_OINTMENT | CUTANEOUS | Status: DC | PRN

## 2022-01-27 MED ORDER — FERROUS SULFATE 325 (65 FE) MG PO TABS
325.0000 mg | ORAL_TABLET | ORAL | Status: DC
  Administered 2022-01-27 – 2022-01-29 (×2): 325 mg via ORAL
  Filled 2022-01-27 (×2): qty 1

## 2022-01-27 MED ORDER — ACETAMINOPHEN 325 MG PO TABS
650.0000 mg | ORAL_TABLET | ORAL | Status: DC | PRN
  Administered 2022-01-27 – 2022-01-30 (×11): 650 mg via ORAL
  Filled 2022-01-27 (×11): qty 2

## 2022-01-27 MED ORDER — MENTHOL 3 MG MT LOZG: 1.0000 | LOZENGE | OROMUCOSAL | Status: DC | PRN

## 2022-01-27 NOTE — Progress Notes (Signed)
POSTPARTUM PROGRESS NOTE  POD #1  Subjective:  Makayla Kramer is a 31 y.o. M5H8469 s/p pLTCS at [redacted]w[redacted]d for NRFHT. No acute events overnight. She reports she is doing well. She denies any problems with ambulating, voiding or po intake. Denies nausea or vomiting. She has passed flatus. Pain is well controlled.  Lochia is adequate.  Objective: Blood pressure (!) 103/51, pulse (!) 54, temperature 97.9 F (36.6 C), temperature source Oral, resp. rate 18, height 5\' 11"  (1.803 m), weight 106.4 kg, SpO2 100 %, unknown if currently breastfeeding.  Physical Exam:  General: alert, cooperative and no distress Chest: no respiratory distress Heart: regular rate, distal pulses intact Uterine Fundus: firm, appropriately tender DVT Evaluation: No calf swelling or tenderness Extremities: mild edema Skin: warm, dry; incision clean/dry/intact w/ honeycomb dressing in place  Recent Labs    01/26/22 0405 01/27/22 0318  HGB 12.0 11.3*  HCT 34.7* 33.4*    Assessment/Plan: Makayla Kramer is a 31 y.o. G2X5284 s/p s/p pLTCS at [redacted]w[redacted]d for NRFHT  POD#2 - Doing welll; pain well controlled. H/H appropriate  Routine postpartum care  OOB, ambulated  Lovenox for VTE prophylaxis Anemia: asymptomatic  Start po ferrous sulfate every other day Substance use: SW consult pending Contraception: unsure Feeding: bottle  Dispo: Plan for discharge in the next 24-48 hours.   LOS: 1 day   Shelda Pal, DO OB Fellow  01/27/2022, 7:31 AM

## 2022-01-27 NOTE — Addendum Note (Signed)
Addendum  created 01/27/22 1509 by Ignacia Bayley, CRNA   Clinical Note Signed

## 2022-01-27 NOTE — Anesthesia Postprocedure Evaluation (Signed)
Anesthesia Post Note  Patient: Makayla Kramer  Procedure(s) Performed: [place]     Patient location during evaluation: Mother Baby Anesthesia Type: Epidural Level of consciousness: awake and alert Pain management: pain level controlled Vital Signs Assessment: post-procedure vital signs reviewed and stable Respiratory status: spontaneous breathing, nonlabored ventilation and respiratory function stable Cardiovascular status: stable Postop Assessment: no headache, no backache and epidural receding Anesthetic complications: no   No notable events documented.  Last Vitals:  Vitals:   01/27/22 0548 01/27/22 0835  BP:  112/63  Pulse:  (!) 52  Resp: 18 17  Temp:  37.4 C  SpO2:      Last Pain:  Vitals:   01/27/22 1100  TempSrc:   PainSc: 2    Pain Goal:                   Cyler Kappes

## 2022-01-27 NOTE — Social Work (Addendum)
CSW acknowledged consult and made second attempt to meet with MOB. However, bedside nurse was attending to MOB.  CSW will meet with MOB at a later time.   3:53 CSW attempted to meet , MOB not in the room, CSW will follow up with MOB tomorrow.   Daeja Helderman, LCSWA Clinical Social Worker [phone] 

## 2022-01-27 NOTE — Anesthesia Postprocedure Evaluation (Signed)
Anesthesia Post Note  Patient: Makayla Kramer  Procedure(s) Performed: CESAREAN SECTION     Patient location during evaluation: PACU Anesthesia Type: Epidural and General Level of consciousness: oriented and awake and alert Pain management: pain level controlled Vital Signs Assessment: post-procedure vital signs reviewed and stable Respiratory status: spontaneous breathing, respiratory function stable and nonlabored ventilation Cardiovascular status: blood pressure returned to baseline and stable Postop Assessment: no headache, no backache, no apparent nausea or vomiting and epidural receding Anesthetic complications: no   No notable events documented.  Last Vitals:  Vitals:   01/27/22 0000 01/27/22 0002  BP:  (!) 99/52  Pulse: 63 65  Resp: 15 13  Temp:    SpO2: 98% 98%    Last Pain:  Vitals:   01/27/22 0028  TempSrc: Oral  PainSc:    Pain Goal:                   Lidia Collum

## 2022-01-27 NOTE — Social Work (Signed)
CSW acknowledged consult and attempted to meet with MOB. However, MOB had multiple guest in the room. MOB asked CSW to come later. CSW will meet with MOB at a later time.  Tennile Styles, LCSWA Clinical Social Worker [phone] 

## 2022-01-28 DIAGNOSIS — Z298 Encounter for other specified prophylactic measures: Secondary | ICD-10-CM | POA: Diagnosis not present

## 2022-01-28 LAB — SURGICAL PATHOLOGY

## 2022-01-28 MED ORDER — HYDROMORPHONE HCL 1 MG/ML IJ SOLN
0.5000 mg | Freq: Once | INTRAMUSCULAR | Status: AC
  Administered 2022-01-28: 0.5 mg via INTRAVENOUS
  Filled 2022-01-28: qty 1

## 2022-01-28 MED ORDER — CHEWING GUM (ORBIT) SUGAR FREE
1.0000 | CHEWING_GUM | Freq: Three times a day (TID) | ORAL | Status: DC
  Administered 2022-01-28 (×3): 1 via ORAL
  Filled 2022-01-28: qty 1

## 2022-01-28 MED ORDER — KETOROLAC TROMETHAMINE 30 MG/ML IJ SOLN
30.0000 mg | Freq: Once | INTRAMUSCULAR | Status: AC
  Administered 2022-01-28: 30 mg via INTRAVENOUS
  Filled 2022-01-28: qty 1

## 2022-01-28 MED ORDER — SIMETHICONE 80 MG PO CHEW
160.0000 mg | CHEWABLE_TABLET | Freq: Three times a day (TID) | ORAL | Status: DC
  Administered 2022-01-28 – 2022-01-30 (×5): 160 mg via ORAL
  Filled 2022-01-28 (×6): qty 2

## 2022-01-28 MED ORDER — SENNOSIDES-DOCUSATE SODIUM 8.6-50 MG PO TABS
3.0000 | ORAL_TABLET | Freq: Two times a day (BID) | ORAL | Status: DC
  Administered 2022-01-28 – 2022-01-29 (×3): 3 via ORAL
  Administered 2022-01-29: 2 via ORAL
  Filled 2022-01-28 (×4): qty 3

## 2022-01-28 NOTE — Progress Notes (Signed)
POSTPARTUM PROGRESS NOTE  POD #2  Subjective:  Makayla Kramer is a 31 y.o. T5T7322 s/p pLTCS at [redacted]w[redacted]d for NRFHT. No acute events overnight. She reports she is doing well. She denies any problems with ambulating, voiding or po intake. Denies nausea or vomiting. She has NOT passed flatus. Pain is NOT well controlled.  Lochia is adequate.  Objective: Blood pressure 105/68, pulse (!) 59, temperature 98 F (36.7 C), temperature source Oral, resp. rate 18, height 5\' 11"  (1.803 m), weight 106.4 kg, SpO2 100 %, unknown if currently breastfeeding.  Physical Exam:  General: alert, cooperative and no distress Chest: no respiratory distress Heart: regular rate, distal pulses intact Abd: ttp RUQ, RLQ Uterine Fundus: firm, appropriately tender DVT Evaluation: No calf swelling or tenderness Extremities: mild edema Skin: warm, dry; incision clean/dry/intact w/ honeycomb dressing in place  Recent Labs    01/26/22 0405 01/27/22 0318  HGB 12.0 11.3*  HCT 34.7* 33.4*    Assessment/Plan: Makayla Kramer is a 31 y.o. G2R4270 s/p s/p pLTCS at [redacted]w[redacted]d for NRFHT  POD#2 - pain not well controlled. H/H appropriate  Routine postpartum care  OOB, ambulate  Lovenox for VTE prophylaxis  Toradol for pain, increase senna Anemia: asymptomatic  Start po ferrous sulfate every other day Substance use: SW consult pending Contraception: discuss at ppv Feeding: bottle  Dispo: Plan for discharge in the next 24-48 hours.   LOS: 2 days   Shelda Pal, DO OB Fellow  01/28/2022, 5:45 AM

## 2022-01-28 NOTE — Clinical Social Work Maternal (Addendum)
CLINICAL SOCIAL WORK MATERNAL/CHILD NOTE   Patient Details  Name: Makayla Kramer MRN: [id number] Date of Birth: 02/08/1991   Date:  01/28/2022   Clinical Social Worker Initiating Note:  Ravleen Ries, LCSWA   Date/Time: Initiated:  01/28/22/1345              Child's Name:  Makayla Kramer    Biological Parents:  Mother, Father (Reginna Salmela 09/11/1989, Norman Kramer)    Need for Interpreter:  None    Reason for Referral:  Current Substance Use/Substance Use During Pregnancy      Address:  [address] [place] [address]    Phone number:  [phone] (home)      Additional phone number:    Household Members/Support Persons (HM/SP):   Household Member/Support Person 1     HM/SP Name Relationship DOB or Age  HM/SP -1 Rachel Armstrong mother 02/13/1964  HM/SP -2     HM/SP -3     HM/SP -4     HM/SP -5     HM/SP -6     HM/SP -7     HM/SP -8         Natural Supports (not living in the home):  Children, Spouse/significant other    Professional Supports:      Employment: Unemployed    Type of Work:      Education:  31 to 10 years    Homebound arranged: No   Financial Resources:  Medicaid    Other Resources:  Food Stamps  , WIC    Cultural/Religious Considerations Which May Impact Care:     Strengths:  Pediatrician chosen, Compliance with medical plan      Psychotropic Medications:          Pediatrician:    [place] area   Pediatrician List:    [facility] ([address])  [place]   [place]   [place]   [place]   [place]       Pediatrician Fax Number:     Risk Factors/Current Problems:  Substance Use      Cognitive State:  Alert      Mood/Affect:  Calm  , Comfortable      CSW Assessment: CSW met with MOB to complete assessment and offer support. CSW entered the room, introduced self,. CSW role and reason for visit. CSW observed MOB in  bed holding the infant. CSW inquired about how MOB was feeling, MOB reported she was  feeling alright. MOB reported her delivery did not go well, MOB reported she had to get an emergency Csection. CSW actively listened while MOB processed her felling regarding he delivery. CSW inquired about and MH concerns, MOB reported she was diagnosed with Anxiety and Depression around age 31. MOB reports not current treatment but is open to medication. CSW provided a list of MH resources. CSW inquired about the past 7 days due to MOB EPDS score of 16. MOB Stated " I have a lot going on" CSW inquired about life stressors. MOB was not open to discussing stressors and stated "it's just life". CSW assessed for safety, MOB denied any SI,HI or DV. MOB identified her supports are FOB, her mom and her sister. CSW provided education regarding the baby blues period vs. perinatal mood disorders, discussed treatment and gave resources for mental health follow up if concerns arise.  CSW recommends self-evaluation during the postpartum time period using the New Mom Checklist   from Postpartum Progress and encouraged MOB to contact a medical professional if symptoms are noted at any time.  CSW inquired bout necessary items for the infant MOB reported she has a Pack n Play for the infant to sleep a care seat but could use resources for diaper and formula. CSW notified MOB that a referral could be made for diapers. MOB agreeable to [facility] referral. CSW inquired if MOB has WIC or Food Stamps, MOB reported she has both. CSW inquired if MOB had made a WIC appointment, MOB stated "it is 3 weeks away". CSW inquired if MOB would be able to use food stamps to get Formula until then MOB stated no. CSW notified MOB the hospital does not provide formula to go home with, MOB reported " we will figure it out, he's not going to starve".     CSW provided review of Sudden Infant Death Syndrome (SIDS) precautions.    CSW inquired about noted THC and  Cocaine use in pregnancy. MOB reported she last used THC prior to coming to the hospital and stated "it's not a drug it's a plant". MOB reported she quit using Cocaine when she found out she was pregnant. CSW notified MOB of Hospital Drug Screen Policy, MOB voiced understanding. CSW notified MOB a CPS report would be made due to her admitting she used illicit substances during the pregnancy. MOB voiced understanding. Multiple attempts at collecting infants urine have been unsuccessful. MOB reported she continued to use THC daily throughout the pregnancy to be able to eat. CSW inquired about MOB's limited prenatal care. MOB reported she did not have transportation to her appointments. MOB reported she will have transportation to infants appointments.   CSW made CPS report to [place] CPS worker Debbie Davis, no barriers to discharge.     CSW Plan/Description:  Sudden Infant Death Syndrome (SIDS) Education, Perinatal Mood and Anxiety Disorder (PMADs) Education, CSW Will Continue to Monitor Umbilical Cord Tissue Drug Screen Results and Make Report if Warranted, Child Protective Service Report  , Other Information/Referral to Community Resources, Hospital Drug Screen Policy Information, No Further Intervention Required/No Barriers to Discharge      Malloree Raboin M Rajinder Mesick, LCSW 01/28/2022, 1:54 PM 

## 2022-01-28 NOTE — Progress Notes (Signed)
Pt given IV dilaudid at 0813 per MD order. RN was readying to return to room at 0840 to reassess pain level when nursing station phone rang. Call was from pt's mother who said she received a call from her daughter who was outside the hospital smoking, and her daughter said she felt really lightheaded and then the phone was dropped. This RN and NT J. Lungsford ran outside in search of patient. Staff finally located patient and wheelchair escorted patient back to room. Pt still complaining of dizziness at this time. Upon returning back to room, RN educated patient again on the side effects of the medication she just received (which were discussed with administration also), and reminded pt of the importance of safety and checking with staff when wanting to walk outside. Pt verbalized understanding and apologized. Pt now rating pain at 8/10. Will continue to monitor.

## 2022-01-28 NOTE — Social Work (Signed)
CSW acknowledged consult and completed a clinical assessment.    Clinical assessment notes will be entered at a later time.  Xcaret Morad, LCSWA Clinical Social Worker [phone]  

## 2022-01-29 MED ORDER — IBUPROFEN 600 MG PO TABS: 600.0000 mg | ORAL_TABLET | Freq: Four times a day (QID) | ORAL | 0 refills | Status: DC

## 2022-01-29 MED ORDER — SENNOSIDES-DOCUSATE SODIUM 8.6-50 MG PO TABS: 3.0000 | ORAL_TABLET | Freq: Two times a day (BID) | ORAL | 0 refills | Status: DC

## 2022-01-29 MED ORDER — FERROUS SULFATE 325 (65 FE) MG PO TABS: 325.0000 mg | ORAL_TABLET | ORAL | 3 refills | Status: DC

## 2022-01-29 MED ORDER — SLYND 4 MG PO TABS: 1.0000 | ORAL_TABLET | Freq: Every day | ORAL | 12 refills | Status: DC

## 2022-01-29 MED ORDER — SIMETHICONE 80 MG PO CHEW: 80.0000 mg | CHEWABLE_TABLET | ORAL | 0 refills | Status: DC | PRN

## 2022-01-29 MED ORDER — OXYCODONE HCL 5 MG PO TABS: 10.0000 mg | ORAL_TABLET | ORAL | 0 refills | Status: AC | PRN

## 2022-01-29 MED ORDER — ACETAMINOPHEN 325 MG PO TABS: 650.0000 mg | ORAL_TABLET | ORAL | 0 refills | Status: AC | PRN

## 2022-01-29 MED ORDER — ONDANSETRON HCL 4 MG PO TABS
4.0000 mg | ORAL_TABLET | Freq: Three times a day (TID) | ORAL | Status: DC | PRN
  Administered 2022-01-29 – 2022-01-30 (×2): 4 mg via ORAL
  Filled 2022-01-29 (×2): qty 1

## 2022-01-29 MED ORDER — GABAPENTIN 100 MG PO CAPS: 100.0000 mg | ORAL_CAPSULE | Freq: Three times a day (TID) | ORAL | 0 refills | Status: DC

## 2022-01-29 NOTE — Progress Notes (Signed)
Per dayshift RN, baby not being discharged today (peds saw baby later in afternoon/evening), mom with discharge order but not discharged from system yet. Pt has had tylenol and oxycodone roughly every 4 hours today as well as scheduled motrin. Pt reports intermittent nausea and vomiting since last night. Pt also had informed dayshift RN that she does not have ability to get pain medication prescriptions. Dr. Janus Molder notified and states to cancel discharge order for today and will discharge pt tomorrow. RN requested prn po nausea medication as well.

## 2022-01-29 NOTE — Progress Notes (Signed)
CSW acknowledges TOC consult placed due to MOB reporting formula needs and prior DSS involvement. CSW completed consult 01/28/22 and made CPS report to [place] CPS worker, Debbie Davis. Per Debbie Davis, no barriers to discharge. Infant's RN updated.   Please contact CSW if additional needs arise.   Signed,  Naisha Wisdom K. Kiarrah Rausch, MSW, LCSWA, LCASA 01/29/2022 9:58 AM   

## 2022-01-30 DIAGNOSIS — Z419 Encounter for procedure for purposes other than remedying health state, unspecified: Secondary | ICD-10-CM | POA: Diagnosis not present

## 2022-01-30 NOTE — Progress Notes (Signed)
Patient came out asking when she could go home. Patient states she feels great and has tolerated food today. Will continue with discharge. Maxwell Caul, Leretha Dykes [place]

## 2022-01-30 NOTE — Progress Notes (Signed)
Dr. Shawna Orleans saw patient and instructed patient to get lunch and to see how she handled it. Dr. Shawna Orleans and this RN instructed patient that if she vomited, to keep it to show staff. Dr. Shawna Orleans stated that if patient is able to tolerate her lunch, then she may be discharged. Maxwell Caul, Leretha Dykes [place]

## 2022-01-30 NOTE — Progress Notes (Signed)
At 1210, entered room to check on patient. Patient was asleep. Briefly spoke to patient and asked her if she had lunch. Patient stated she had some potatoes. This RN asked patient if she tolerated it and/or has vomited; however, patient fell back asleep and did not answer. Maxwell Caul, Leretha Dykes [place]

## 2022-01-30 NOTE — Discharge Summary (Signed)
Postpartum Discharge Summary  Date of Service updated 01/30/22    Patient Name: Makayla Kramer DOB: 1991/02/07 MRN: [id number]  Date of admission: 01/26/2022 Delivery date:01/26/2022  Delivering provider: Janyth Pupa  Date of discharge: 01/30/2022  Admitting diagnosis: Pregnancy affected by fetal growth restriction [O36.5990] Intrauterine pregnancy: [redacted]w[redacted]d    Secondary diagnosis:  Principal Problem:   Cesarean delivery delivered Active Problems:   Herpes simplex virus (HSV) infection   Bipolar I disorder ([place]   Supervision of other normal pregnancy, antepartum   Limited prenatal care   Pregnancy affected by fetal growth restriction  Additional problems: N/a    Discharge diagnosis: Term Pregnancy Delivered                                              Post partum procedures: n/a Augmentation: AROM, Pitocin, Cytotec, and IP Foley Complications: None  Hospital course: Induction of Labor With Cesarean Section   31y.o. yo [id number] admitted to the hospital 01/26/2022 for induction of labor. Patient had a labor course significant for failed IOL. The patient went for cesarean section due to Non-Reassuring FHR. Delivery details are as follows: Membrane Rupture Time/Date: 2:20 PM ,01/26/2022   Delivery Method:C-Section, Low Transverse  Details of operation can be found in separate operative Note.  Patient had an uncomplicated postpartum course. She is ambulating, tolerating a regular diet, passing flatus, and urinating well.  Patient is discharged home in stable condition on 01/30/22.      Newborn Data: Birth date:01/26/2022  Birth time:10:15 PM  Gender:Female  Living status:Living  Apgars:8 ,9  Weight:2530 g                                Magnesium Sulfate received: No BMZ received: No Rhophylac:N/A MMR:N/A T-DaP: n/a Flu: N/A Transfusion:No  Physical exam  Vitals:   01/28/22 2006 01/29/22 0551 01/29/22 2022 01/30/22 0527  BP: 105/62 117/62 114/66 128/70   Pulse: 94 70 84 79  Resp: 18 18 18 16   Temp: 98.1 F (36.7 C) 98.2 F (36.8 C) 98.9 F (37.2 C) 99.1 F (37.3 C)  TempSrc: Oral Oral Oral Oral  SpO2: 100% 100% 100% 100%  Weight:      Height:       General: alert, cooperative, and no distress Lochia: appropriate Uterine Fundus: firm Incision: Dressing is clean, dry, and intact DVT Evaluation: No significant calf/ankle edema. Labs: Lab Results  Component Value Date   WBC 20.2 (H) 01/27/2022   HGB 11.3 (L) 01/27/2022   HCT 33.4 (L) 01/27/2022   MCV 96.0 01/27/2022   PLT 218 01/27/2022      Latest Ref Rng & Units 06/06/2020    1:47 AM  CMP  Glucose 70 - 99 mg/dL 121   BUN 6 - 20 mg/dL 13   Creatinine 0.44 - 1.00 mg/dL 1.07   Sodium 135 - 145 mmol/L 141   Potassium 3.5 - 5.1 mmol/L 3.2   Chloride 98 - 111 mmol/L 104   CO2 22 - 32 mmol/L 22   Calcium 8.9 - 10.3 mg/dL 9.5   Total Protein 6.5 - 8.1 g/dL 6.7   Total Bilirubin 0.3 - 1.2 mg/dL 1.0   Alkaline Phos 38 - 126 U/L 53   AST 15 - 41 U/L 14   ALT 0 -  44 U/L 10    Edinburgh Score:    01/27/2022    8:48 PM  Edinburgh Postnatal Depression Scale Screening Tool  I have been able to laugh and see the funny side of things. 1  I have looked forward with enjoyment to things. 2  I have blamed myself unnecessarily when things went wrong. 1  I have been anxious or worried for no good reason. 2  I have felt scared or panicky for no good reason. 2  Things have been getting on top of me. 2  I have been so unhappy that I have had difficulty sleeping. 2  I have felt sad or miserable. 2  I have been so unhappy that I have been crying. 3  The thought of harming myself has occurred to me. 1  Edinburgh Postnatal Depression Scale Total 18   Has been seen by social work and there are no barrier to discharge.   After visit meds:  Allergies as of 01/30/2022   No Known Allergies      Medication List     STOP taking these medications    PrePLUS 27-1 MG Tabs    valACYclovir 500 MG tablet Commonly known as: Valtrex       TAKE these medications    acetaminophen 325 MG tablet Commonly known as: TYLENOL Take 2 tablets (650 mg total) by mouth every 4 (four) hours as needed for mild pain (temperature > 101.5.).   ferrous sulfate 325 (65 FE) MG tablet Take 1 tablet (325 mg total) by mouth every other day.   gabapentin 100 MG capsule Commonly known as: Neurontin Take 1 capsule (100 mg total) by mouth 3 (three) times daily for 10 days.   ibuprofen 600 MG tablet Commonly known as: ADVIL Take 1 tablet (600 mg total) by mouth every 6 (six) hours.   oxyCODONE 5 MG immediate release tablet Commonly known as: Oxy IR/ROXICODONE Take 2 tablets (10 mg total) by mouth every 4 (four) hours as needed for up to 7 days for moderate pain.   senna-docusate 8.6-50 MG tablet Commonly known as: Senokot-S Take 3 tablets by mouth 2 (two) times daily.   simethicone 80 MG chewable tablet Commonly known as: MYLICON Chew 1 tablet (80 mg total) by mouth as needed for flatulence.   Slynd 4 MG Tabs Generic drug: Drospirenone Take 1 tablet by mouth daily for 28 days.               Discharge Care Instructions  (From admission, onward)           Start     Ordered   01/29/22 0000  Discharge wound care:       Comments: C-section wound care: You may feel pain/discomfort/burning sensation for several weeks. Keep the wound area clean by washing it with mild soap and water. You don't need to scrub it. Often, just letting the water run over your wound in the shower is enough. We do not recommend creams as this can cause infection, but we do recommend oral medication (prescribed), pressure dressings and running warm water on the wound to help ease discomfort/burning pain.   01/29/22 0804             Discharge home in stable condition Infant Feeding: Bottle and Breast Infant Disposition:home with mother Discharge instruction: per After Visit Summary  and Postpartum booklet. Activity: Advance as tolerated. Pelvic rest for 6 weeks.  Diet: routine diet Future Appointments: Future Appointments  Date Time Provider [place]  02/03/2022  3:00 PM [place]  02/22/2022  1:15 PM [facility] CL[facility]  02/25/2022 10:35 AM Radene Gunning, MD [facility]   Follow up Visit: Message sent to [facility] 01/30/22  Please schedule this patient for a In person postpartum visit in 6 weeks with the following provider: Any provider. Additional Postpartum F/U:Postpartum Depression checkup and Incision check 1 week  Low risk pregnancy complicated by:  n/a Delivery mode:  C-Section, Low Transverse  Anticipated Birth Control:  Unsure   01/30/2022 Trask Vosler Autry-Lott, DO

## 2022-01-30 NOTE — Progress Notes (Signed)
During morning assessment, patient stated that she feels much better this morning; however, her significant other asked this RN if it was normal that she "threw up black last night." This RN asked if it possibly looked like something she had previously eaten; however, patient could not describe it other than "it was just black." Notified Dr. Shawna Orleans who stated that he would go in to speak with the patient. Maxwell Caul, Leretha Dykes [place]

## 2022-01-31 ENCOUNTER — Encounter: Payer: Medicaid Other | Admitting: Obstetrics and Gynecology

## 2022-02-03 ENCOUNTER — Ambulatory Visit: Payer: Medicaid Other

## 2022-02-03 ENCOUNTER — Telehealth (HOSPITAL_COMMUNITY): Payer: Self-pay | Admitting: *Deleted

## 2022-02-03 DIAGNOSIS — Z1331 Encounter for screening for depression: Secondary | ICD-10-CM

## 2022-02-03 NOTE — Telephone Encounter (Signed)
Hospital Discharge Follow-Up Call:  Patient called back and discharge follow-up call completed.  Patient reports that she needs to come to hospital for incision check because "it keeps getting bigger and then goes down".  She says she missed an appointment at [facility] office today because she "just couldn't get there" and "I don't really want nobody watching him either".  Encouraged her to have her incision checked and that she could bring her baby with her to her doctor's office.  Also confirmed that she could come to MAU if she feels she needs to be seen before an office appointment is available.  EPDS today was 22 with Question 10 being a 1.  PHQ-2&9 completed with respective scores of 3 and 12.  C-SSRS Risk Category:  No Risk.  Patient says she thinks she need medication for depression and I encouraged her to speak to provider at office about this.  Also informed patient that she could go to the [facility] [place] on [address] anytime day or night for help.  Order for IBH placed and Dr. Ilda Basset notified of above information.  Patient says that baby is well and she has no concerns about baby's health.  She reports that baby sleeps in a bassinet.  Reviewed ABCs of Safe Sleep.

## 2022-02-03 NOTE — Telephone Encounter (Signed)
Attempted Hospital Discharge Follow-Up Call.  Left voice mail requesting that patient return RN's phone call if patient has any concerns or questions regarding herself or her baby.  

## 2022-02-07 ENCOUNTER — Encounter: Payer: Medicaid Other | Admitting: Family Medicine

## 2022-02-08 ENCOUNTER — Ambulatory Visit (INDEPENDENT_AMBULATORY_CARE_PROVIDER_SITE_OTHER): Payer: Medicaid Other

## 2022-02-08 VITALS — BP 126/74 | HR 75 | Wt 227.6 lb

## 2022-02-08 DIAGNOSIS — L7682 Other postprocedural complications of skin and subcutaneous tissue: Secondary | ICD-10-CM

## 2022-02-08 DIAGNOSIS — Z5189 Encounter for other specified aftercare: Secondary | ICD-10-CM

## 2022-02-08 DIAGNOSIS — T8149XA Infection following a procedure, other surgical site, initial encounter: Secondary | ICD-10-CM

## 2022-02-08 MED ORDER — AMOXICILLIN-POT CLAVULANATE 875-125 MG PO TABS: 1.0000 | ORAL_TABLET | Freq: Two times a day (BID) | ORAL | 0 refills | Status: DC

## 2022-02-08 MED ORDER — OXYCODONE HCL 5 MG PO TABS: 5.0000 mg | ORAL_TABLET | ORAL | 0 refills | Status: DC | PRN

## 2022-02-08 NOTE — Progress Notes (Signed)
Incision Check Visit  Makayla Kramer is here for incision check following primary c-section on 01/26/22. Pt missed scheduled incision check and called this morning to report pain and bleeding from incision site. Dressing applied by patient at home was removed; red blood present on dressing. Incision appears closed with dried blood partially covering. Surrounding area appears swollen, area directly above incision is firm to touch. Pt is in visible pain; reports pain 9/10 on pain scale. Pt has been unable to pick up oxycodone due to insurance coverage. Currently taking ibuprofen for the pain. Dione Plover, MD to bedside for assessment. Provider recommends antibiotic course to prevent any possible infection. Recommends maternity support belt to elevate area of stomach with dependant edema. Reviewed good wound care with patient and return precautions. Pt given abdominal pads and support belt; RN assisted patient with application. Augmentin and oxycodone sent to patient's preferred pharmacy by provider. Pt also reports no BM in 2-3 days; recommended Miralax otc.  Edinburgh screening is positive today; pt denies thoughts of harm. Offered [facility] services to patient; pt declines. Pt to follow up in 1 week for nurse visit.   Annabell Howells, RN 02/08/2022  10:24 AM

## 2022-02-08 NOTE — BH Specialist Note (Signed)
Integrated Behavioral Health Follow Up In-Person Visit  MRN: [id number] Name: Makayla Kramer  Number of [place] Clinician visits: 2- Second Visit  Session Start time: 0825   Session End time: [id number]  Total time in minutes: 28   Types of Service: Individual psychotherapy  Interpretor:No. Interpretor Name and Language: n/a  Subjective: Makayla Kramer is a 31 y.o. female accompanied by  n/a Patient was referred by Karsten Fells, MD for positive depression screen. Patient reports the following symptoms/concerns: Stopped attending [facility] for outpatient [facility] service due to unable to take baby; primary concern today is continued bleeding from stitches since birth one month ago; looking for housing. Passive SI with no intent and no plan.  Duration of problem: Ongoing BH issues;stitches bleeding since cesarean one month ; Severity of problem:  moderately severe  Objective: Mood: Anxious and Depressed and Affect: Tearful Risk of harm to self or others: Suicidal ideation No plan to harm self or others  Life Context: Family and Social: Pt lives with children  School/Work: - Self-Care: self-advocacy Life Changes: Recent cesarean, housing instability  Patient and/or Family's Strengths/Protective Factors: Concrete supports in place (healthy food, safe environments, etc.) and Sense of purpose  Goals Addressed: Patient will:  Reduce symptoms of: anxiety, depression, mood instability, and stress   Increase knowledge and/or ability of: healthy habits   Demonstrate ability to: Increase healthy adjustment to current life circumstances, Increase adequate support systems for patient/family, and Increase motivation to adhere to plan of care  Progress towards Goals: Ongoing  Interventions: Interventions utilized:  Psychoeducation and/or Health Education and Safety Standardized Assessments completed: GAD-7 and PHQ 9  Patient and/or Family Response: Patient agrees with  treatment plan.   Patient Centered Plan: Patient is on the following Treatment Plan(s): IBH Assessment: Patient currently experiencing Bipolar affective disorder and Psychosocial stress.   Patient may benefit from psychoeducation and brief therapeutic interventions regarding coping with symptoms of depression, anxiety, life stress .  Plan: Follow up with behavioral health clinician on : Phone check in about one week Behavioral recommendations:  -Continue taking prenatal vitamin daily as prescribed --Continue prioritizing healthy self-care (regular meals, adequate rest; allowing practical help from supportive friends and family) until at least postpartum medical appointment -Consider new mom support group as needed at either [email or web address].net or [email or web address]  -Accept referral to psychiatry -Accept referral to [place] working with housing agencies; calling weekly to stay on housing lists -Use [facility] 24/7 as needed -Make sure voicemail is set up and not full to receive call to set up initial psychiatry appointment  Referral(s): [place] (In Clinic), [place] (LME/Outside Clinic), and Community Resources:  Food  [place], [place]     02/22/2022    2:39 PM 02/03/2022    7:12 PM 01/17/2022    5:13 PM 12/07/2021    5:22 PM  Depression screen PHQ 2/9  Decreased Interest 1 1 2 2   Down, Depressed, Hopeless 1 2 2 3   PHQ - 2 Score 2 3 4 5   Altered sleeping 1 1 1 2   Tired, decreased energy 1 1 2 2   Change in appetite 1 1 2 2   Feeling bad or failure about yourself  1 2 2 3   Trouble concentrating 2 1 2 1   Moving slowly or fidgety/restless 0 2 1 2   Suicidal thoughts 2 1 1  0  PHQ-9 Score 10 12 15 17   Difficult doing work/chores  Somewhat difficult  02/22/2022    2:39 PM 01/17/2022    5:14 PM 12/07/2021    5:22 PM  GAD 7 : Generalized Anxiety Score  Nervous, Anxious, on Edge 2 1 2    Control/stop worrying 2 1 2   Worry too much - different things 2 2 3   Trouble relaxing 2 2 2   Restless 2 2 2   Easily annoyed or irritable 2 3 3   Afraid - awful might happen 2 2 2   Total GAD 7 Score 14 13 16

## 2022-02-14 ENCOUNTER — Other Ambulatory Visit: Payer: Medicaid Other

## 2022-02-14 ENCOUNTER — Encounter: Payer: Medicaid Other | Admitting: Family Medicine

## 2022-02-15 ENCOUNTER — Ambulatory Visit (INDEPENDENT_AMBULATORY_CARE_PROVIDER_SITE_OTHER): Payer: Medicaid Other | Admitting: Family Medicine

## 2022-02-15 ENCOUNTER — Telehealth: Payer: Self-pay | Admitting: Obstetrics & Gynecology

## 2022-02-15 ENCOUNTER — Other Ambulatory Visit: Payer: Self-pay

## 2022-02-15 ENCOUNTER — Encounter: Payer: Self-pay | Admitting: Obstetrics & Gynecology

## 2022-02-15 ENCOUNTER — Inpatient Hospital Stay (HOSPITAL_COMMUNITY)
Admission: AD | Admit: 2022-02-15 | Discharge: 2022-02-15 | Disposition: A | Discharge status: Home / Self Care | Payer: Medicaid Other | Attending: Obstetrics & Gynecology | Admitting: Obstetrics & Gynecology

## 2022-02-15 VITALS — BP 124/76 | HR 78 | Wt 216.0 lb

## 2022-02-15 DIAGNOSIS — Z5189 Encounter for other specified aftercare: Secondary | ICD-10-CM

## 2022-02-15 DIAGNOSIS — Z4889 Encounter for other specified surgical aftercare: Secondary | ICD-10-CM | POA: Insufficient documentation

## 2022-02-15 MED ORDER — CEPHALEXIN 250 MG PO CAPS: 250.0000 mg | ORAL_CAPSULE | Freq: Two times a day (BID) | ORAL | Status: DC

## 2022-02-15 MED ORDER — SILVER NITRATE-POT NITRATE 75-25 % EX MISC
CUTANEOUS | Status: AC
  Filled 2022-02-15: qty 10

## 2022-02-15 MED ORDER — SILVER NITRATE-POT NITRATE 75-25 % EX MISC
1.0000 | Freq: Once | CUTANEOUS | Status: AC
  Administered 2022-02-15: 1 via TOPICAL

## 2022-02-15 MED ORDER — OXYCODONE-ACETAMINOPHEN 5-325 MG PO TABS
2.0000 | ORAL_TABLET | Freq: Once | ORAL | Status: AC
  Administered 2022-02-15: 2 via ORAL
  Filled 2022-02-15: qty 2

## 2022-02-15 MED ORDER — CEPHALEXIN 250 MG PO CAPS: 250.0000 mg | ORAL_CAPSULE | Freq: Two times a day (BID) | ORAL | 0 refills | Status: DC

## 2022-02-15 NOTE — Progress Notes (Signed)
Dr. Elly Modena into eval incision and discuss POC.

## 2022-02-15 NOTE — Progress Notes (Signed)
Here for wound recheck. Had C/S 01/26/22. Had wound check on 02/08/22 with reports of pain , bleeding and was placed on antibiotics and given support belt.  Today states she is still having bleeding of dark brown/blackish from incision everyday. States she keeps it covered with abd pads. States swelling has gotten better. States still having pain and oxycodone is helping.  Removed pad that was at least 50%saturated with dark bloody drainage . Wound with 3 open area in center. Dr.Eckstat in to view wound. Recommended she be sent to hospital for evaluation and treatment of wound.  Staci Acosta

## 2022-02-15 NOTE — MAU Note (Signed)
.  Makayla Kramer is a 31 y.o. at Unknown here in MAU reporting: she was sent from [facility] office for incision check.  Reports incision is draining blood.  S/P Cesarean section 01/26/2022 LMP: N/A Onset of complaint: today Pain score: 0 Vitals:   02/15/22 1709  BP: 124/78  Pulse: 83  Temp: 97.9 F (36.6 C)  SpO2: 100%     FHT:N/A Lab orders placed from triage:   None

## 2022-02-15 NOTE — Telephone Encounter (Signed)
Tried calling patient home and both cell phone numbers- no answer and unable to leave a message.  Will also send my chart message encouraging pt to come in for further evaluation  Janyth Pupa, DO Attending [place], [facility] for [employer], [place]

## 2022-02-15 NOTE — Progress Notes (Addendum)
Dr. Kerrie Pleasure into evacuate incision of blood clots @ dehiscence areas.

## 2022-02-15 NOTE — MAU Provider Note (Addendum)
History     CSN: [id number]  Arrival date and time: 02/15/22 1653   Event Date/Time   First Provider Initiated Contact with Patient 02/15/22 1714      Chief Complaint  Patient presents with   Incision Check   Patient presented 3 weeks post op from [place] after being sent from the office for concern of poor wound healing at incision site. She has minimal pain on palpation and states she has not been taking any pain medication at home. She has been using the abdominal binder daily to run most of her errands. She is afebrile.    OB History     Gravida  3   Para  2   Term  2   Preterm  0   AB  1   Living  2      SAB  1   IAB  0   Ectopic  0   Multiple  0   Live Births  2           Past Medical History:  Diagnosis Date   Anxiety    Depression    GERD (gastroesophageal reflux disease)    Gonorrhea    Headache    Herpes simplex    hsv (outbreak over a year ago)   Mental disorder    Syphilis    Trichomonas     Past Surgical History:  Procedure Laterality Date   CESAREAN SECTION N/A 01/26/2022   Procedure: CESAREAN SECTION;  Surgeon: Janyth Pupa, DO;  Location: [facility] LD ORS;  Service: Obstetrics;  Laterality: N/A;   TOOTH EXTRACTION      Family History  Problem Relation Age of Onset   Asthma Son    Cancer Maternal Grandmother    Diabetes Maternal Grandmother    Heart disease Neg Hx    Hypertension Neg Hx    Stroke Neg Hx     Social History   Tobacco Use   Smoking status: Every Day    Packs/day: 0.25    Years: 3.00    Total pack years: 0.75    Types: Cigarettes   Smokeless tobacco: Never  Vaping Use   Vaping Use: Never used  Substance Use Topics   Alcohol use: Not Currently    Comment: not while preg   Drug use: Yes    Types: Marijuana, Cocaine    Comment: last use weed Dec 20, 2021    Allergies: No Known Allergies  Medications Prior to Admission  Medication Sig Dispense Refill Last Dose   acetaminophen (TYLENOL) 325 MG tablet  Take 2 tablets (650 mg total) by mouth every 4 (four) hours as needed for mild pain (temperature > 101.5.). 30 tablet 0    amoxicillin-clavulanate (AUGMENTIN) 875-125 MG tablet Take 1 tablet by mouth 2 (two) times daily. 10 tablet 0    Drospirenone (SLYND) 4 MG TABS Take 1 tablet by mouth daily for 28 days. (Patient not taking: Reported on 02/08/2022) 28 tablet 12    ferrous sulfate 325 (65 FE) MG tablet Take 1 tablet (325 mg total) by mouth every other day. 30 tablet 3    gabapentin (NEURONTIN) 100 MG capsule Take 1 capsule (100 mg total) by mouth 3 (three) times daily for 10 days. 30 capsule 0    ibuprofen (ADVIL) 600 MG tablet Take 1 tablet (600 mg total) by mouth every 6 (six) hours. 30 tablet 0    oxyCODONE (ROXICODONE) 5 MG immediate release tablet Take 1-2 tablets (5-10 mg total) by mouth  every 4 (four) hours as needed for severe pain. 20 tablet 0    senna-docusate (SENOKOT-S) 8.6-50 MG tablet Take 3 tablets by mouth 2 (two) times daily. (Patient not taking: Reported on 02/08/2022) 30 tablet 0    simethicone (MYLICON) 80 MG chewable tablet Chew 1 tablet (80 mg total) by mouth as needed for flatulence. (Patient not taking: Reported on 02/08/2022) 30 tablet 0     Review of Systems  Constitutional:  Negative for activity change and appetite change.   Physical Exam   Blood pressure 124/78, pulse 83, temperature 97.9 F (36.6 C), temperature source Oral, height 5\' 9"  (1.753 m), weight 98 kg, SpO2 100 %, not currently breastfeeding.  Physical Exam Constitutional:      General: She is not in acute distress.    Appearance: Normal appearance.  Abdominal:     General: Abdomen is flat. There is no distension.     Palpations: Abdomen is soft.  Skin:    General: Skin is warm and dry.     Findings: Bruising and lesion present. No rash.     Comments: Low transverse incision s/p CS; circular dehiscence with three primary sites of opening. Hematoma formation and leakage into open sites. No sign of  infection. Well healed at periphery of scar and on lateral edges.  Neurological:     General: No focal deficit present.     Mental Status: She is alert.     Assessment and Plan   Incision Dehiscence s/p LTCS; Hematoma preventing epithelialization and granulation formation.  - Debridement and removal of coagulated blood - Hemostasis on skin edge with silver nitrate applicators - Reapproximated with dermabond steri-strips; Assisted by PA student - Cephalexin 250 mg BID x 5 days - 1 week follow up for incision check  Justice Deeds 02/15/2022, 5:38 PM

## 2022-02-18 DIAGNOSIS — Z7689 Persons encountering health services in other specified circumstances: Secondary | ICD-10-CM | POA: Diagnosis not present

## 2022-02-22 ENCOUNTER — Ambulatory Visit (INDEPENDENT_AMBULATORY_CARE_PROVIDER_SITE_OTHER): Payer: Medicaid Other | Admitting: Clinical

## 2022-02-22 ENCOUNTER — Ambulatory Visit (INDEPENDENT_AMBULATORY_CARE_PROVIDER_SITE_OTHER): Payer: Medicaid Other

## 2022-02-22 VITALS — BP 123/65 | HR 93 | Temp 98.3°F | Wt 206.7 lb

## 2022-02-22 DIAGNOSIS — Z658 Other specified problems related to psychosocial circumstances: Secondary | ICD-10-CM

## 2022-02-22 DIAGNOSIS — F3130 Bipolar disorder, current episode depressed, mild or moderate severity, unspecified: Secondary | ICD-10-CM

## 2022-02-22 DIAGNOSIS — Z5189 Encounter for other specified aftercare: Secondary | ICD-10-CM

## 2022-02-22 DIAGNOSIS — Z7689 Persons encountering health services in other specified circumstances: Secondary | ICD-10-CM | POA: Diagnosis not present

## 2022-02-22 NOTE — Patient Instructions (Signed)
[facility] [place] [facility] [address] [place], [place] [address] [phone] (main office) [phone] (Karston Hyland's office)  New Parent Support Groups [email or web address].net [email or web address]   [facility]  [address], [place], [place] [address] [phone] or [phone] WALK-IN URGENT CARE 24/7 FOR ANYONE [address], [place], [place]  [phone] Fax: [phone] [email or web address] *Interpreters available *Accepts all insurance and uninsured for Urgent Care needs *Accepts Medicaid and uninsured for outpatient treatment (below)    ONLY FOR [place] Residents  Below:   Outpatient New Patient Assessment/Therapy Walk-ins:        Monday -Thursday 8am until slots are full.        Every Friday 1pm-4pm  (first come, first served)                   New Patient Psychiatry/Medication Management        Monday-Friday 8am-11am (first come, first served)              For all walk-ins we ask that you arrive by 7:15am, because patients will be seen in the order of arrival.     

## 2022-02-22 NOTE — Progress Notes (Signed)
Here today for behavioral health visit. Pt reports bleeding from incision site to [facility]. Pt taken to private exam room following [facility] visit and incision assessed by RN. Incision is draining small amount of bright red blood. Multiple open areas seen at incision site. Not able to visualize wound bed due to blood clot obscuring view. Surrounding area is less indurated and red than on assessment over last few weeks. Abdomen soft to palpation, generalized edema to lower abdomen appears to be resolving. Pt reports she is almost finished with Keflex antibiotic course prescribed last week. New supply of ABD pads given to patient. Reviewed need for shower once daily and replacement of ABD pad. Pt to return on Friday, 02/25/22 for PP visit. MAU return precautions reviewed.  Apolonio Schneiders RN 02/22/22

## 2022-02-24 DIAGNOSIS — Z7689 Persons encountering health services in other specified circumstances: Secondary | ICD-10-CM | POA: Diagnosis not present

## 2022-02-25 ENCOUNTER — Ambulatory Visit: Payer: Self-pay | Admitting: Obstetrics and Gynecology

## 2022-03-01 ENCOUNTER — Ambulatory Visit (INDEPENDENT_AMBULATORY_CARE_PROVIDER_SITE_OTHER): Payer: Medicaid Other | Admitting: Family Medicine

## 2022-03-01 DIAGNOSIS — Z1331 Encounter for screening for depression: Secondary | ICD-10-CM

## 2022-03-01 DIAGNOSIS — F53 Postpartum depression: Secondary | ICD-10-CM

## 2022-03-01 DIAGNOSIS — T8131XD Disruption of external operation (surgical) wound, not elsewhere classified, subsequent encounter: Secondary | ICD-10-CM

## 2022-03-01 MED ORDER — FLUOXETINE HCL 20 MG PO CAPS: 20.0000 mg | ORAL_CAPSULE | Freq: Every day | ORAL | 3 refills | Status: AC

## 2022-03-01 NOTE — Progress Notes (Signed)
Entered in error

## 2022-03-01 NOTE — Progress Notes (Signed)
[place] Partum Visit Note  Makayla Kramer is a 31 y.o. G29P2012 female who presents for a postpartum visit. She is 4 weeks postpartum following a primary cesarean section.  I have fully reviewed the prenatal and intrapartum course. The delivery was at 38.2 gestational weeks.  Anesthesia: general. Postpartum course has been complicated by incisina nonhealing and pain. Baby is doing well. Baby is feeding by bottle. Bleeding no bleeding. Bowel function is normal. Bladder function is normal. Patient is not sexually active. Contraception method is none. Postpartum depression screening: positive.   The pregnancy intention screening data noted above was reviewed. Potential methods of contraception were discussed. The patient elected to proceed with No data recorded.   Edinburgh Postnatal Depression Scale - 03/01/22 1409       Edinburgh Postnatal Depression Scale:  In the Past 7 Days   I have been able to laugh and see the funny side of things. 0    I have looked forward with enjoyment to things. 0    I have blamed myself unnecessarily when things went wrong. 2    I have been anxious or worried for no good reason. 2    I have felt scared or panicky for no good reason. 2    Things have been getting on top of me. 2    I have been so unhappy that I have had difficulty sleeping. 2    I have felt sad or miserable. 2    I have been so unhappy that I have been crying. 2    The thought of harming myself has occurred to me. 0    Edinburgh Postnatal Depression Scale Total 14             Health Maintenance Due  Topic Date Due   COVID-19 Vaccine (1) Never done   TETANUS/TDAP  Never done   INFLUENZA VACCINE  Never done    The following portions of the patient's history were reviewed and updated as appropriate: allergies, current medications, past family history, past medical history, past social history, past surgical history, and problem list.  Review of Systems Pertinent items are noted in  HPI.  Objective:  There were no vitals taken for this visit.   General:  alert, cooperative, and appears stated age   Breasts:  normal  Lungs: clear to auscultation bilaterally  Heart:  regular rate and rhythm, S1, S2 normal, no murmur, click, rub or gallop  Abdomen: abnormal findings:  obese and incisional non-healing pfannenstiel. I    Wound wound edges not approximated  GU exam:  not indicated       Assessment:    1. Postpartum care following cesarean delivery  2. Depression screen - Amb ref to [facility]  3. Postpartum depression - FLUoxetine (PROZAC) 20 MG capsule; Take 1 capsule (20 mg total) by mouth daily.  Dispense: 30 capsule; Refill: 3 - [facility]  4. Open abdominal incision with drainage, subsequent encounter   Pt's incision is slowly healing from c-section on 9/27. Dr. Dione Plover who saw her last visit also present in this encounter noted improvement in healing. Sent PP support groups per [facility] here at today's visit. Started meds as above. F/up in 4 weeks  Abnormal postpartum exam.   Plan:   Essential components of care per ACOG recommendations:  1.  Mood and well being: Patient with positive depression screening today. Reviewed local resources for support.  - Patient tobacco use? No.   -  hx of drug use? Yes. Referral to [facility] accepted.    2. Infant care and feeding:  -Patient currently breastmilk feeding? No.  -Social determinants of health (SDOH) reviewed in EPIC. The following needs were identified: pp depression, h/o substance use. [facility] referral made  3. Sexuality, contraception and birth spacing - Patient does not know want a pregnancy in the next year.  - Reviewed reproductive life planning. Reviewed contraceptive methods based on pt preferences and effectiveness.  Patient desired Abstinence today.   - Discussed birth spacing of 18 months  4. Sleep and fatigue -Encouraged family/partner/community support of 4  hrs of uninterrupted sleep to help with mood and fatigue  5. Physical Recovery  - Discussed patients delivery and complications. She describes her labor as bad. - Patient had a C-section emergent.  - Patient has urinary incontinence? No. - Patient is not safe to resume physical and sexual activity  6.  Health Maintenance - HM due items addressed Yes - Last pap smear  Diagnosis  Date Value Ref Range Status  09/01/2021   Final   - Negative for intraepithelial lesion or malignancy (NILM)   Pap smear not done at today's visit.  -Breast Cancer screening indicated? No.   7. Chronic Disease/Pregnancy Condition follow up: None  - PCP follow up  [facility] [place] [place]

## 2022-03-02 DIAGNOSIS — Z7689 Persons encountering health services in other specified circumstances: Secondary | ICD-10-CM | POA: Diagnosis not present

## 2022-03-02 DIAGNOSIS — Z419 Encounter for procedure for purposes other than remedying health state, unspecified: Secondary | ICD-10-CM | POA: Diagnosis not present

## 2022-03-11 ENCOUNTER — Telehealth: Payer: Self-pay | Admitting: Clinical

## 2022-03-11 NOTE — Telephone Encounter (Signed)
Attempt to follow up; unable to leave voice message.

## 2022-03-30 ENCOUNTER — Ambulatory Visit: Payer: Medicaid Other | Admitting: Medical

## 2022-04-01 DIAGNOSIS — Z419 Encounter for procedure for purposes other than remedying health state, unspecified: Secondary | ICD-10-CM | POA: Diagnosis not present

## 2022-04-28 DIAGNOSIS — Z7689 Persons encountering health services in other specified circumstances: Secondary | ICD-10-CM | POA: Diagnosis not present

## 2022-05-02 DIAGNOSIS — Z419 Encounter for procedure for purposes other than remedying health state, unspecified: Secondary | ICD-10-CM | POA: Diagnosis not present

## 2022-05-04 ENCOUNTER — Other Ambulatory Visit (HOSPITAL_COMMUNITY)
Admission: RE | Admit: 2022-05-04 | Discharge: 2022-05-04 | Disposition: A | Discharge status: Home / Self Care | Payer: Medicaid Other | Source: Ambulatory Visit | Attending: Certified Nurse Midwife | Admitting: Certified Nurse Midwife

## 2022-05-04 ENCOUNTER — Ambulatory Visit (INDEPENDENT_AMBULATORY_CARE_PROVIDER_SITE_OTHER): Payer: Medicaid Other

## 2022-05-04 ENCOUNTER — Ambulatory Visit: Payer: Medicaid Other

## 2022-05-04 DIAGNOSIS — A64 Unspecified sexually transmitted disease: Secondary | ICD-10-CM | POA: Diagnosis not present

## 2022-05-04 DIAGNOSIS — Z202 Contact with and (suspected) exposure to infections with a predominantly sexual mode of transmission: Secondary | ICD-10-CM | POA: Diagnosis not present

## 2022-05-04 NOTE — Progress Notes (Signed)
Patient presents today with a request for STD testing. Patient states last night her partner admitted to cheating on her and told her he had trichomonas. Pt denies vaginal discharge or irritation. Patient educated in how to perform self swab. Swab obtained. Blood work ordered and collected. Patient informed she would receive results in 24-48 hours via MyChart. Pt verbalized understanding and denied further questions.

## 2022-05-05 LAB — RPR: RPR Ser Ql: NONREACTIVE

## 2022-05-05 LAB — HEPATITIS C ANTIBODY: Hep C Virus Ab: NONREACTIVE

## 2022-05-05 LAB — HIV ANTIBODY (ROUTINE TESTING W REFLEX): HIV Screen 4th Generation wRfx: NONREACTIVE

## 2022-05-05 LAB — HEPATITIS B SURFACE ANTIGEN: Hepatitis B Surface Ag: NEGATIVE

## 2022-05-06 LAB — CERVICOVAGINAL ANCILLARY ONLY
Bacterial Vaginitis (gardnerella): POSITIVE — AB
Candida Glabrata: NEGATIVE
Candida Vaginitis: NEGATIVE
Chlamydia: NEGATIVE
Comment: NEGATIVE
Comment: NEGATIVE
Comment: NEGATIVE
Comment: NEGATIVE
Comment: NEGATIVE
Comment: NORMAL
Neisseria Gonorrhea: NEGATIVE
Trichomonas: NEGATIVE

## 2022-05-10 ENCOUNTER — Other Ambulatory Visit: Payer: Self-pay | Admitting: Certified Nurse Midwife

## 2022-05-10 DIAGNOSIS — N760 Acute vaginitis: Secondary | ICD-10-CM

## 2022-05-10 MED ORDER — METRONIDAZOLE 500 MG PO TABS: 500.0000 mg | ORAL_TABLET | Freq: Two times a day (BID) | ORAL | 0 refills | Status: DC

## 2022-05-14 IMAGING — US US OB COMP LESS 14 WK
1 series · 15 of 22 positions shown · non-contrast
Comparison: None.

CLINICAL DATA: Uncertain dating.

EXAM:
OBSTETRIC <14 WK ULTRASOUND
TECHNIQUE: Transabdominal ultrasound was performed for evaluation of the
gestation as well as the maternal uterus and adnexal regions.

[Series 1: us ob comp less 14 wk · 22 acquisitions, 15 frames shown]
[im 1/22]
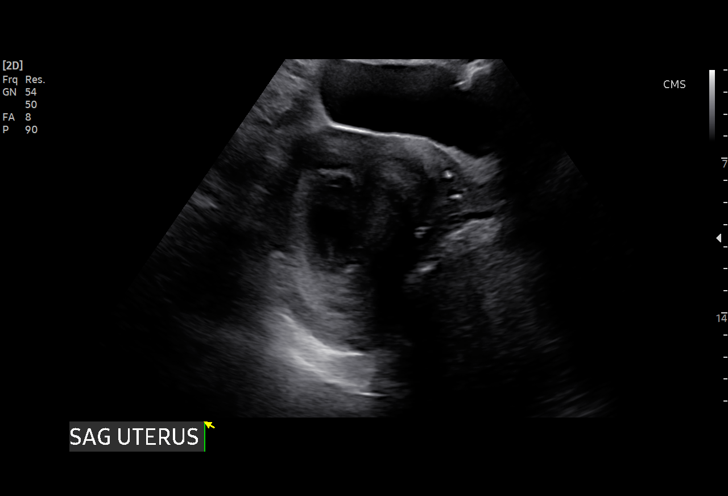
[im 3/22]
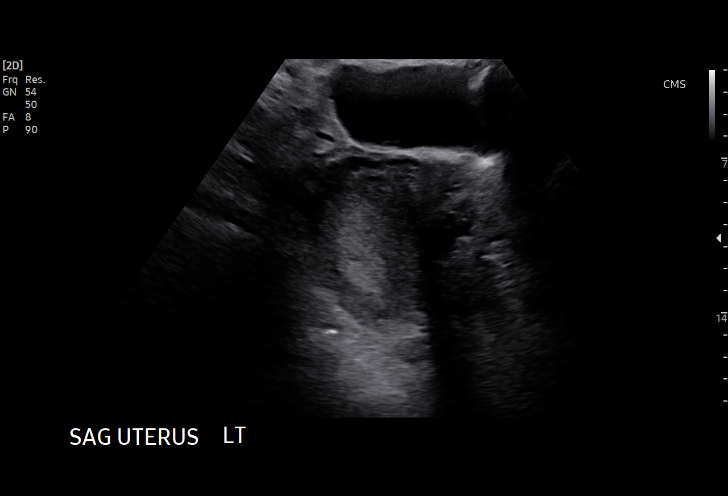
[im 4/22]
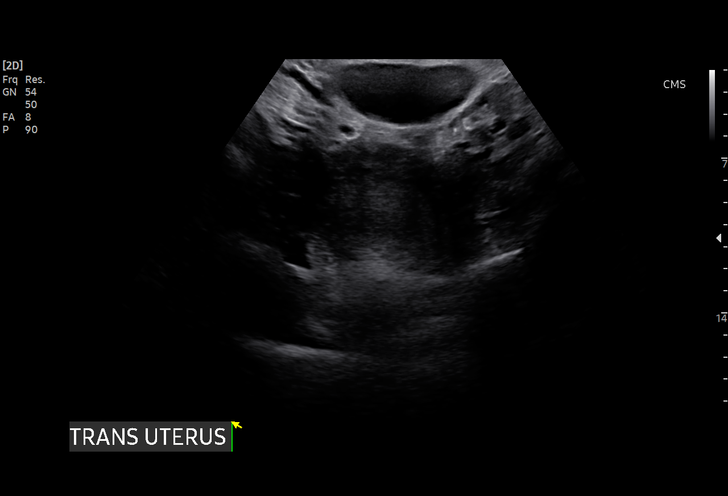
[im 6/22]
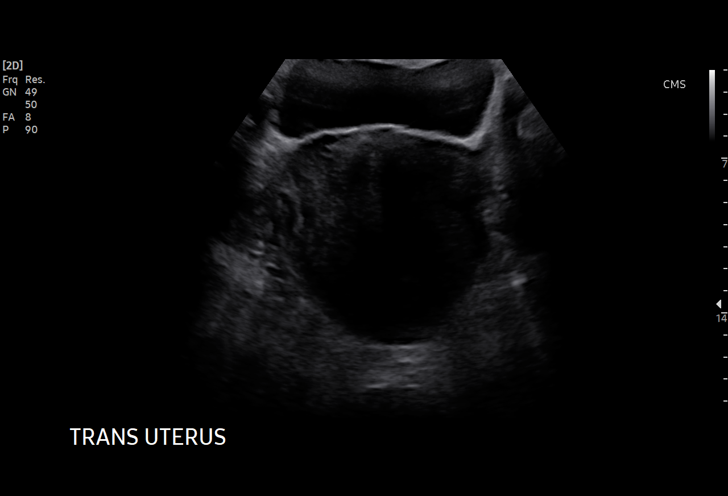
[im 7/22]
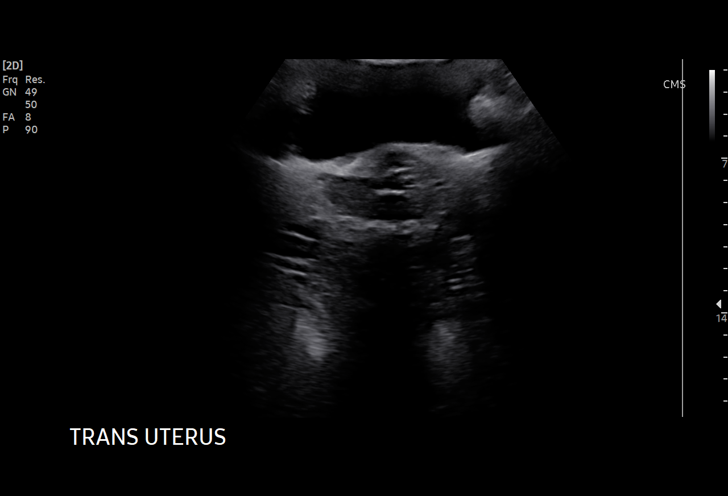
[im 9/22]
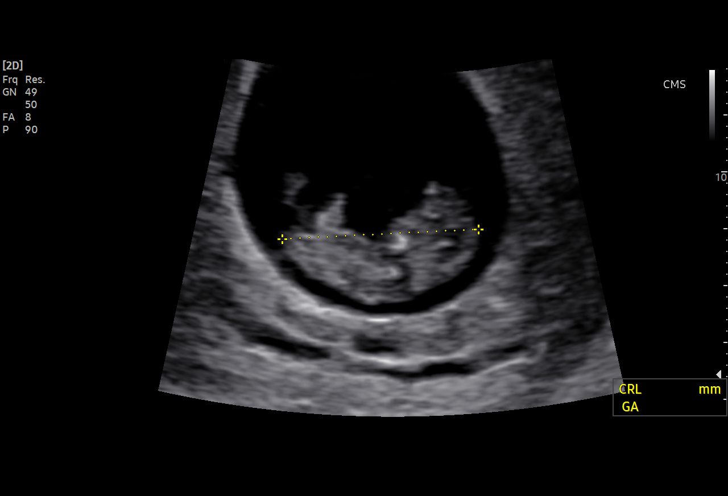
[im 10/22]
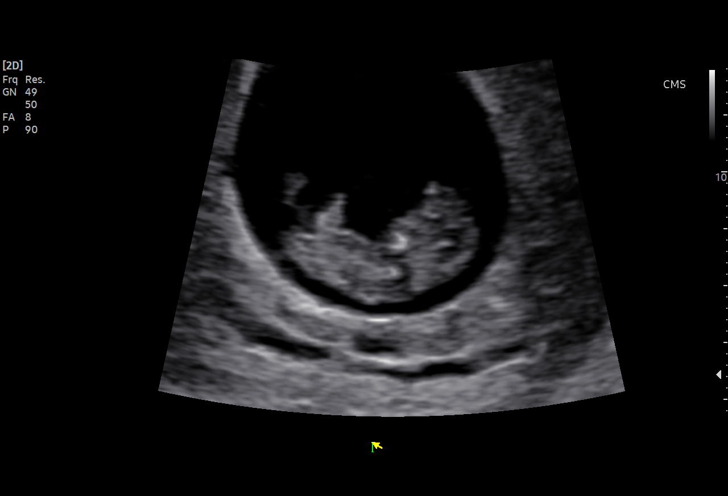
[im 12/22]
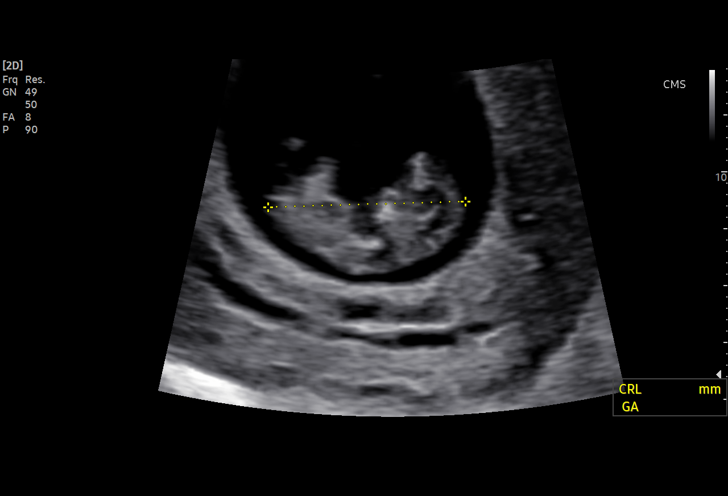
[im 13/22]
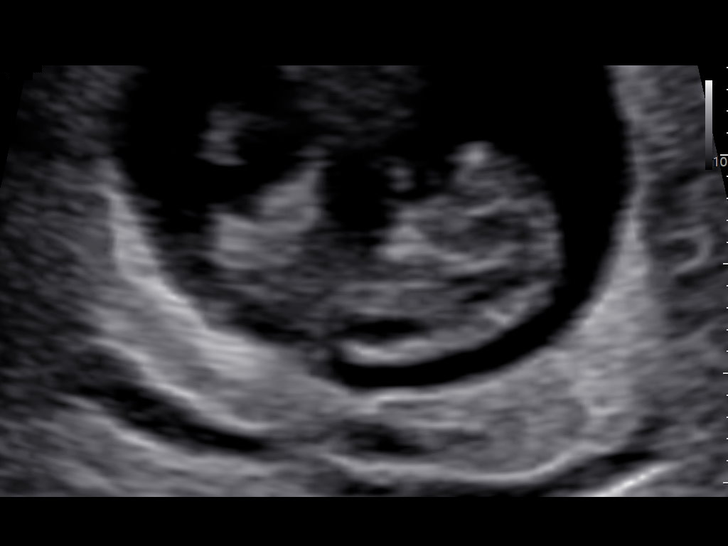
[im 14/22]
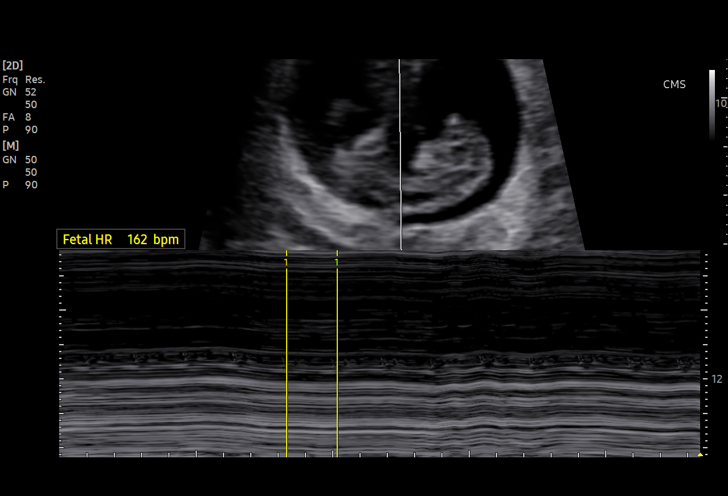
[im 16/22]
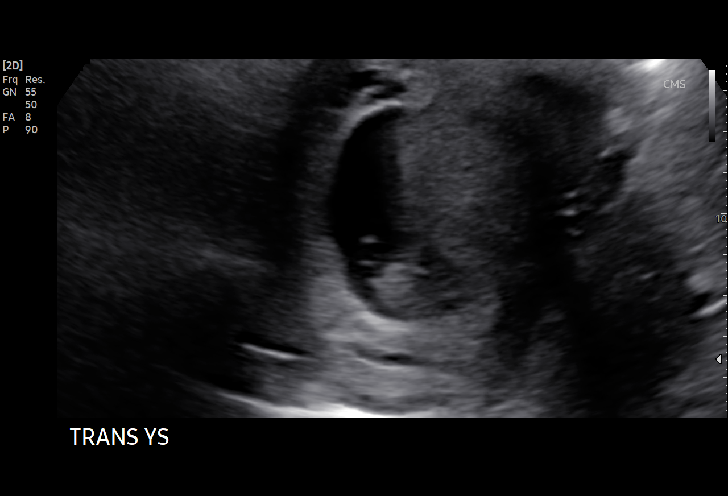
[im 17/22]
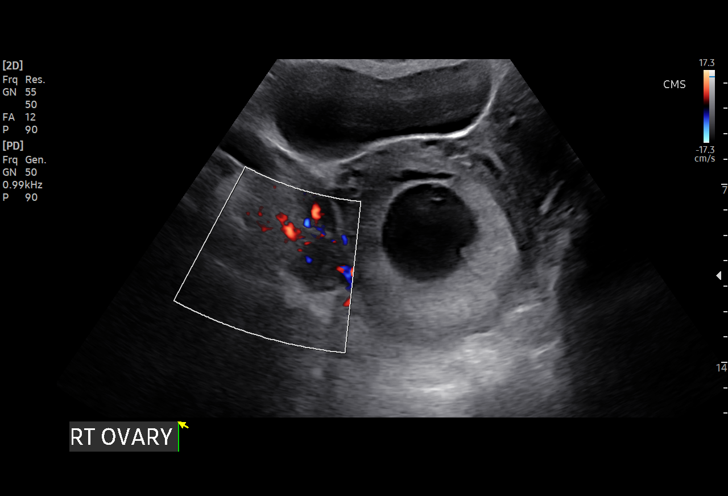
[im 19/22]
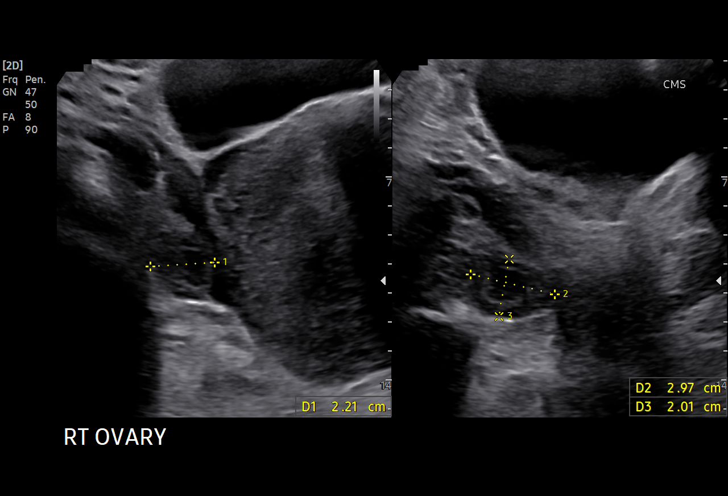
[im 20/22]
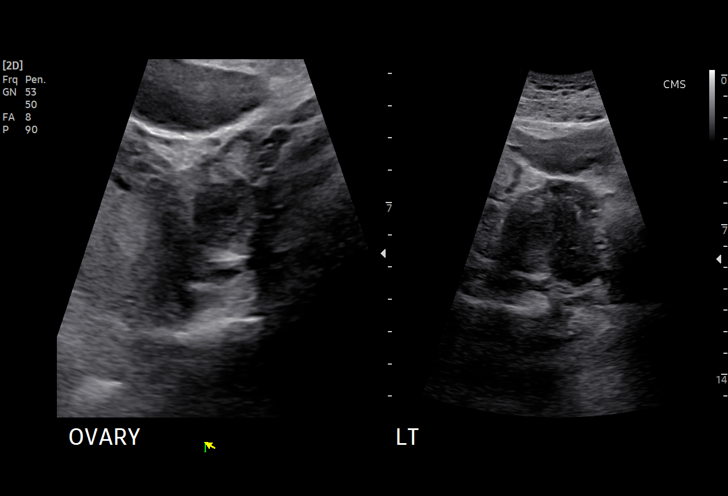
[im 22/22]
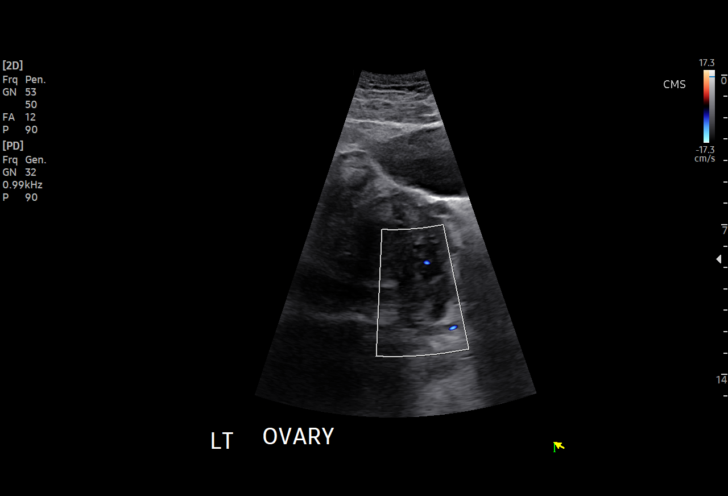

[15 of 22 positions shown; findings below may reference images not displayed]

FINDINGS: Intrauterine gestational sac: Single

Yolk sac:  Visualized.

Embryo:  Visualized.

Cardiac Activity: Visualized.

Heart Rate: 162 bpm

CRL:   35 mm   10 w 3 d                  US EDC: 02/07/2022

Subchorionic hemorrhage:  None visualized.

Maternal uterus/adnexae: Bilateral ovaries within normal limits. No
free fluid.
IMPRESSION: 1. Single live intrauterine gestation measuring 10 weeks 3 days by
crown-rump length.

## 2022-05-26 ENCOUNTER — Telehealth: Payer: Self-pay

## 2022-05-26 NOTE — Telephone Encounter (Signed)
Mychart msg sent. AS, CMA 

## 2022-06-02 DIAGNOSIS — Z419 Encounter for procedure for purposes other than remedying health state, unspecified: Secondary | ICD-10-CM | POA: Diagnosis not present

## 2022-07-01 DIAGNOSIS — Z419 Encounter for procedure for purposes other than remedying health state, unspecified: Secondary | ICD-10-CM | POA: Diagnosis not present

## 2022-08-01 DIAGNOSIS — Z419 Encounter for procedure for purposes other than remedying health state, unspecified: Secondary | ICD-10-CM | POA: Diagnosis not present

## 2022-08-31 DIAGNOSIS — Z419 Encounter for procedure for purposes other than remedying health state, unspecified: Secondary | ICD-10-CM | POA: Diagnosis not present

## 2022-10-01 DIAGNOSIS — Z419 Encounter for procedure for purposes other than remedying health state, unspecified: Secondary | ICD-10-CM | POA: Diagnosis not present

## 2022-10-31 DIAGNOSIS — Z419 Encounter for procedure for purposes other than remedying health state, unspecified: Secondary | ICD-10-CM | POA: Diagnosis not present

## 2022-12-01 DIAGNOSIS — Z419 Encounter for procedure for purposes other than remedying health state, unspecified: Secondary | ICD-10-CM | POA: Diagnosis not present

## 2023-01-01 DIAGNOSIS — Z419 Encounter for procedure for purposes other than remedying health state, unspecified: Secondary | ICD-10-CM | POA: Diagnosis not present

## 2023-01-31 DIAGNOSIS — Z419 Encounter for procedure for purposes other than remedying health state, unspecified: Secondary | ICD-10-CM | POA: Diagnosis not present

## 2023-03-03 DIAGNOSIS — Z419 Encounter for procedure for purposes other than remedying health state, unspecified: Secondary | ICD-10-CM | POA: Diagnosis not present

## 2023-04-02 DIAGNOSIS — Z419 Encounter for procedure for purposes other than remedying health state, unspecified: Secondary | ICD-10-CM | POA: Diagnosis not present

## 2023-04-03 ENCOUNTER — Ambulatory Visit (INDEPENDENT_AMBULATORY_CARE_PROVIDER_SITE_OTHER): Payer: 59 | Admitting: *Deleted

## 2023-04-03 DIAGNOSIS — Z3201 Encounter for pregnancy test, result positive: Secondary | ICD-10-CM

## 2023-04-03 DIAGNOSIS — Z3200 Encounter for pregnancy test, result unknown: Secondary | ICD-10-CM

## 2023-04-03 DIAGNOSIS — Z3687 Encounter for antenatal screening for uncertain dates: Secondary | ICD-10-CM

## 2023-04-03 LAB — POCT PREGNANCY, URINE: Preg Test, Ur: POSITIVE — AB

## 2023-04-03 NOTE — Progress Notes (Signed)
Makayla Kramer dropped off a urine for a pregnancy test which was positive. I called [name] and gave her results. She confirms she had 2 periods in October and she thinks her LMP was about 02/14/23. I offered dating [place] to confirm dating and she agreed with plan. Scheduled for dating [place] for 04/05/23. Informed her she will need to call to schedule new ob  intake and new ob visit as she plans to go here again. She is taking PNV.  [name]

## 2023-04-05 ENCOUNTER — Ambulatory Visit (HOSPITAL_COMMUNITY)
Admission: RE | Admit: 2023-04-05 | Discharge: 2023-04-05 | Disposition: A | Discharge status: Home / Self Care | Payer: 59 | Source: Ambulatory Visit | Attending: Family Medicine | Admitting: Family Medicine

## 2023-04-05 DIAGNOSIS — Z3687 Encounter for antenatal screening for uncertain dates: Secondary | ICD-10-CM | POA: Diagnosis not present

## 2023-04-05 DIAGNOSIS — Z3A00 Weeks of gestation of pregnancy not specified: Secondary | ICD-10-CM | POA: Diagnosis not present

## 2023-04-05 DIAGNOSIS — N854 Malposition of uterus: Secondary | ICD-10-CM | POA: Diagnosis not present

## 2023-04-05 DIAGNOSIS — Z3682 Encounter for antenatal screening for nuchal translucency: Secondary | ICD-10-CM | POA: Diagnosis not present

## 2023-04-11 ENCOUNTER — Telehealth: Payer: Self-pay | Admitting: *Deleted

## 2023-04-11 DIAGNOSIS — O3680X0 Pregnancy with inconclusive fetal viability, not applicable or unspecified: Secondary | ICD-10-CM

## 2023-04-11 NOTE — Telephone Encounter (Signed)
Natsuko  left a message she can't get in her Mychart and is calling to get results/next appointment. I called Ladonya and reviewed [place] results with her and informed her [place] did not show live baby so we recommend follow up US to confirm viability. I explained once we have [place] confirms viability she can call front office to schedule new ob visit. I explained I will schedule [place] and call her back with appointment. I offered to schedule on 12/16 but she states due to work she prefers 04/19/23 at [facility]. I explained if she does not get a call with results  of the [place] she can call office in 1-2 days. I scheduled [place] for 04/19/23 10:00, arrive 9:30 with full bladder. I called Ester and informed her of [place] appointment. [name]

## 2023-04-19 ENCOUNTER — Ambulatory Visit (HOSPITAL_COMMUNITY)
Admission: RE | Admit: 2023-04-19 | Discharge: 2023-04-19 | Disposition: A | Discharge status: Home / Self Care | Payer: 59 | Source: Ambulatory Visit | Attending: Family Medicine | Admitting: Family Medicine

## 2023-04-19 DIAGNOSIS — Z3687 Encounter for antenatal screening for uncertain dates: Secondary | ICD-10-CM | POA: Diagnosis not present

## 2023-04-19 DIAGNOSIS — Z3682 Encounter for antenatal screening for nuchal translucency: Secondary | ICD-10-CM | POA: Diagnosis not present

## 2023-04-19 DIAGNOSIS — O3680X0 Pregnancy with inconclusive fetal viability, not applicable or unspecified: Secondary | ICD-10-CM | POA: Insufficient documentation

## 2023-04-19 DIAGNOSIS — Z3A01 Less than 8 weeks gestation of pregnancy: Secondary | ICD-10-CM | POA: Diagnosis not present

## 2023-04-21 ENCOUNTER — Inpatient Hospital Stay (HOSPITAL_COMMUNITY)
Admission: AD | Admit: 2023-04-21 | Discharge: 2023-04-21 | Discharge status: Against Medical Advice | Payer: 59 | Attending: Obstetrics & Gynecology | Admitting: Obstetrics & Gynecology

## 2023-04-21 ENCOUNTER — Encounter (HOSPITAL_COMMUNITY): Payer: Self-pay | Admitting: *Deleted

## 2023-04-22 ENCOUNTER — Encounter (HOSPITAL_COMMUNITY): Payer: Self-pay | Admitting: Obstetrics & Gynecology

## 2023-04-22 ENCOUNTER — Inpatient Hospital Stay (HOSPITAL_COMMUNITY)
Admission: AD | Admit: 2023-04-22 | Discharge: 2023-04-22 | Disposition: A | Discharge status: Home / Self Care | Payer: Self-pay | Attending: Obstetrics & Gynecology | Admitting: Obstetrics & Gynecology

## 2023-04-22 ENCOUNTER — Other Ambulatory Visit: Payer: Self-pay

## 2023-04-22 DIAGNOSIS — Z3A09 9 weeks gestation of pregnancy: Secondary | ICD-10-CM | POA: Diagnosis not present

## 2023-04-22 DIAGNOSIS — O209 Hemorrhage in early pregnancy, unspecified: Secondary | ICD-10-CM | POA: Diagnosis not present

## 2023-04-22 DIAGNOSIS — O020 Blighted ovum and nonhydatidiform mole: Secondary | ICD-10-CM | POA: Insufficient documentation

## 2023-04-22 DIAGNOSIS — O26851 Spotting complicating pregnancy, first trimester: Secondary | ICD-10-CM | POA: Insufficient documentation

## 2023-04-22 LAB — URINALYSIS, ROUTINE W REFLEX MICROSCOPIC
Bacteria, UA: NONE SEEN
Bilirubin Urine: NEGATIVE
Glucose, UA: NEGATIVE mg/dL
Ketones, ur: NEGATIVE mg/dL
Leukocytes,Ua: NEGATIVE
Nitrite: NEGATIVE
Protein, ur: NEGATIVE mg/dL
Specific Gravity, Urine: 1.026 (ref 1.005–1.030)
pH: 5 (ref 5.0–8.0)

## 2023-04-22 MED ORDER — ONDANSETRON 4 MG PO TBDP: 4.0000 mg | ORAL_TABLET | Freq: Three times a day (TID) | ORAL | 0 refills | Status: AC | PRN

## 2023-04-22 MED ORDER — MISOPROSTOL 200 MCG PO TABS: ORAL_TABLET | ORAL | 0 refills | Status: AC

## 2023-04-22 MED ORDER — OXYCODONE HCL 5 MG PO TABS: 5.0000 mg | ORAL_TABLET | Freq: Four times a day (QID) | ORAL | 0 refills | Status: AC | PRN

## 2023-04-22 NOTE — MAU Note (Addendum)
Makayla Kramer is a 32 y.o. at [redacted]w[redacted]d here in MAU reporting: She reports spotting that began a week ago after she had intercourse. She reports she hasn't had intercourse since. Patient reports that she has had on the same pad since last night and there isn't any blood on the pad. However, she has spotting when she wipes. She reports that it is similar to when her menstrual period is coming off. Pt also reports she has abdominal cramping that comes and goes.   Onset of complaint: 1 week ago Pain score: 3/10 abdomen  Vitals:   04/22/23 1509  BP: 122/81  Pulse: 68  Resp: 16  Temp: 100 F (37.8 C)     [id number]  Lab orders placed from triage:  none

## 2023-04-22 NOTE — Discharge Instructions (Signed)
I've sent a message to [facility] to schedule a follow-up visit with you in the next 1-2 weeks.

## 2023-04-22 NOTE — MAU Provider Note (Signed)
Chief Complaint: Vaginal Bleeding and Abdominal Pain   Provider seen 1520    SUBJECTIVE HPI: Makayla Kramer is a 32 y.o. [id number] at [redacted]w[redacted]d by early ultrasound who presents to maternity admissions reporting spotting and cramping.  Patient with initial dating ultrasound on 12/4. Had repeat viability scan 12/18. Presents today for spotting for past week when she wipes. Starting to have mild contraction/cramping pain today. She denies fever/chills, urinary symptoms, change in vaginal discharge otherwise.  HPI  Past Medical History:  Diagnosis Date   Anxiety    Depression    GERD (gastroesophageal reflux disease)    Gonorrhea    Headache    Herpes simplex    hsv (outbreak over a year ago)   Mental disorder    Syphilis    Trichomonas    Past Surgical History:  Procedure Laterality Date   CESAREAN SECTION N/A 01/26/2022   Procedure: CESAREAN SECTION;  Surgeon: Myna Hidalgo, DO;  Location: [facility] LD ORS;  Service: Obstetrics;  Laterality: N/A;   TOOTH EXTRACTION     Social History   Socioeconomic History   Marital status: Single    Spouse name: Not on file   Number of children: Not on file   Years of education: Not on file   Highest education level: Not on file  Occupational History   Not on file  Tobacco Use   Smoking status: Every Day    Current packs/day: 0.25    Average packs/day: 0.3 packs/day for 3.0 years (0.8 ttl pk-yrs)    Types: Cigarettes   Smokeless tobacco: Never  Vaping Use   Vaping status: Never Used  Substance and Sexual Activity   Alcohol use: Not Currently    Comment: not while preg   Drug use: Yes    Types: Marijuana, Cocaine    Comment: last use weed Dec 20, 2021   Sexual activity: Yes    Birth control/protection: None  Other Topics Concern   Not on file  Social History Narrative   Not on file   Social Drivers of Health   Financial Resource Strain: Not on file  Food Insecurity: Food Insecurity Present (01/26/2022)   Hunger Vital Sign     Worried About Running Out of Food in the Last Year: Sometimes true    Ran Out of Food in the Last Year: Sometimes true  Transportation Needs: Unmet Transportation Needs (01/26/2022)   PRAPARE - Administrator, Civil Service (Medical): Yes    Lack of Transportation (Non-Medical): Yes  Physical Activity: Not on file  Stress: Not on file  Social Connections: Not on file  Intimate Partner Violence: Not At Risk (01/26/2022)   Humiliation, Afraid, Rape, and Kick questionnaire    Fear of Current or Ex-Partner: No    Emotionally Abused: No    Physically Abused: No    Sexually Abused: No   No current facility-administered medications on file prior to encounter.   Current Outpatient Medications on File Prior to Encounter  Medication Sig Dispense Refill   acetaminophen (TYLENOL) 325 MG tablet Take 2 tablets (650 mg total) by mouth every 4 (four) hours as needed for mild pain (temperature > 101.5.). 30 tablet 0   ferrous sulfate 325 (65 FE) MG tablet Take 1 tablet (325 mg total) by mouth every other day. 30 tablet 3   FLUoxetine (PROZAC) 20 MG capsule Take 1 capsule (20 mg total) by mouth daily. 30 capsule 3   ibuprofen (ADVIL) 600 MG tablet Take 600 mg by mouth  every 6 (six) hours as needed for mild pain or moderate pain.     metroNIDAZOLE (FLAGYL) 500 MG tablet Take 1 tablet (500 mg total) by mouth 2 (two) times daily. 14 tablet 0   oxyCODONE (ROXICODONE) 5 MG immediate release tablet Take 1-2 tablets (5-10 mg total) by mouth every 4 (four) hours as needed for severe pain. 20 tablet 0   No Known Allergies  ROS:  Pertinent positives/negatives listed above.  I have reviewed patient's Past Medical Hx, Surgical Hx, Family Hx, Social Hx, medications and allergies.   Physical Exam  Patient Vitals for the past 24 hrs:  BP Temp Temp src Pulse Resp Height Weight  04/22/23 1509 122/81 100 F (37.8 C) Oral 68 16 5\' 11"  (1.803 m) 110.5 kg   Constitutional: Well-developed, well-nourished  female in no acute distress. Tearful Cardiovascular: normal rate Respiratory: normal effort GI: Abd soft, non-tender. Pos BS x 4 MS: Extremities nontender, no edema, normal ROM Neurologic: Alert and oriented x 4   LAB RESULTS Results for orders placed or performed during the hospital encounter of 04/22/23 (from the past 24 hours)  Urinalysis, Routine w reflex microscopic -Urine, Clean Catch     Status: Abnormal   Collection Time: 04/22/23  3:05 PM  Result Value Ref Range   Color, Urine YELLOW YELLOW   APPearance CLEAR CLEAR   Specific Gravity, Urine 1.026 1.005 - 1.030   pH 5.0 5.0 - 8.0   Glucose, UA NEGATIVE NEGATIVE mg/dL   Hgb urine dipstick MODERATE (A) NEGATIVE   Bilirubin Urine NEGATIVE NEGATIVE   Ketones, ur NEGATIVE NEGATIVE mg/dL   Protein, ur NEGATIVE NEGATIVE mg/dL   Nitrite NEGATIVE NEGATIVE   Leukocytes,Ua NEGATIVE NEGATIVE   RBC / HPF 0-5 0 - 5 RBC/hpf   WBC, UA 0-5 0 - 5 WBC/hpf   Bacteria, UA NONE SEEN NONE SEEN   Squamous Epithelial / HPF 0-5 0 - 5 /HPF   Mucus PRESENT        IMAGING US OB Transvaginal Result Date: 04/22/2023 CLINICAL DATA:  confirm viability and dating EXAM: TRANSVAGINAL OB ULTRASOUND TECHNIQUE: Transvaginal ultrasound was performed for complete evaluation of the gestation as well as the maternal uterus, adnexal regions, and pelvic cul-de-sac. COMPARISON:  04/05/2023. FINDINGS: Intrauterine gestational sac: Single Yolk sac:  Not Visualized. Embryo:  Not Visualized. Cardiac Activity: Not Visualized. MSD: 1.5 cm = 6 weeks 2 days [place] EDC: 12/11/2023 Subchorionic hemorrhage:  None visualized. Adnexa: No masses or fluid collections. IMPRESSION: Empty intrauterine gestational sac measuring 6 weeks 2 days, not significantly changed when compared to the prior study, indicating probable blighted ovum. No adnexal pathology. Electronically Signed   By: Layla Maw M.D.   On: 04/22/2023 13:00   US OB LESS THAN 14 WEEKS WITH OB TRANSVAGINAL Result  Date: 04/07/2023 : PROCEDURE: OBSTETRIC <14 WK [place] AND TRANSVAGINAL OB US HISTORY: Patient is a 32 y/o F with verify dating/viability. LMP 02/15/2023. COMPARISON: None. TECHNIQUE: Two-dimensional transabdominal grayscale ultrasound imaging of the pelvis was performed with color Doppler evaluation. Transvaginal ultrasound was performed. FINDINGS: The uterus is retroverted and measures 8.0 x 5.0 x 6.2 cm and demonstrates a normal homogeneous echotexture. The cervical os is closed. The right ovary measures 3.7 x 2.5 x 2.9 cm and demonstrates a normal homogeneous echotexture. There is normal color Doppler flow. The left ovary measures 4.2 x 1.5 x 1.6 cm and demonstrates a normal homogeneous echotexture. There is normal color Doppler flow. There is no fluid within the cul-de-sac. There is a  single intrauterine gestational sac identified with a mean measurement of 0.93 cm, correlating to a gestational age of [redacted] weeks 5 day(s) (+/- 12 day(s). There is no yolk sac or fetal pole identified. There is no subchorionic hemorrhage visualized. IMPRESSION: 1. Single intrauterine gestational sac 7 weeks, 0 day(s) by LMP. Today's ultrasound measurements correlate with a gestational age of [redacted] weeks 5 day(s) (+/- 12 day(s). No fetal or yolk sac identified. Correlation with beta HCG levels and follow-up ultrasound are recommended. Thank you for allowing [place] to assist in the care of this patient. Electronically Signed   By: Lestine Box M.D.   On: 04/07/2023 07:55    MAU Management/MDM: Orders Placed This Encounter  Procedures   Urinalysis, Routine w reflex microscopic -Urine, Clean Catch   Discharge patient    Meds ordered this encounter  Medications   misoprostol (CYTOTEC) 200 MCG tablet    Sig: Place four tablets in between your gums and cheeks (two tablets on each side) as instructed. If no period in 24 hours, take another four tablets.    Dispense:  8 tablet    Refill:  0   oxyCODONE (ROXICODONE) 5 MG immediate release  tablet    Sig: Take 1 tablet (5 mg total) by mouth every 6 (six) hours as needed for moderate pain (pain score 4-6).    Dispense:  15 tablet    Refill:  0   ondansetron (ZOFRAN-ODT) 4 MG disintegrating tablet    Sig: Take 1 tablet (4 mg total) by mouth every 8 (eight) hours as needed for nausea or vomiting.    Dispense:  15 tablet    Refill:  0    Patient at [redacted]w[redacted]d by early ultrasound who presents for vaginal spotting/cramping. Found to have an anembryonic pregnancy with gestational sac of ~6 weeks without yolk sac or fetal pole on U/S 12/4 and 12/18 consistent with anembryonic pregnancy. Shared these findings with patient. Discussed 3 options for miscarriage management (expectant, medical, surgical). As she has not miscarried in 2 weeks, suggested medical management which patient was agreeable with. Cytotec provided along with zofran, oxycodone for symptoms. Return precautions for bleeding, infection given. Message sent to [facility] for follow-up in 1-2 weeks.  ASSESSMENT 1. Anembryonic pregnancy   2. Vaginal bleeding affecting early pregnancy     PLAN Discharge home with strict return precautions. Allergies as of 04/22/2023   No Known Allergies      Medication List     STOP taking these medications    ferrous sulfate 325 (65 FE) MG tablet   metroNIDAZOLE 500 MG tablet Commonly known as: FLAGYL       TAKE these medications    acetaminophen 325 MG tablet Commonly known as: TYLENOL Take 2 tablets (650 mg total) by mouth every 4 (four) hours as needed for mild pain (temperature > 101.5.).   FLUoxetine 20 MG capsule Commonly known as: PROzac Take 1 capsule (20 mg total) by mouth daily.   ibuprofen 600 MG tablet Commonly known as: ADVIL Take 600 mg by mouth every 6 (six) hours as needed for mild pain or moderate pain.   misoprostol 200 MCG tablet Commonly known as: CYTOTEC Place four tablets in between your gums and cheeks (two tablets on each side) as instructed. If no period  in 24 hours, take another four tablets.   ondansetron 4 MG disintegrating tablet Commonly known as: ZOFRAN-ODT Take 1 tablet (4 mg total) by mouth every 8 (eight) hours as needed for nausea or vomiting.  oxyCODONE 5 MG immediate release tablet Commonly known as: Roxicodone Take 1 tablet (5 mg total) by mouth every 6 (six) hours as needed for moderate pain (pain score 4-6). What changed:  how much to take when to take this reasons to take this         Wylene Simmer, MD OB Fellow 04/22/2023  3:50 PM

## 2023-05-03 DIAGNOSIS — Z419 Encounter for procedure for purposes other than remedying health state, unspecified: Secondary | ICD-10-CM | POA: Diagnosis not present

## 2023-05-11 ENCOUNTER — Ambulatory Visit: Payer: 59 | Admitting: Obstetrics & Gynecology

## 2023-06-03 DIAGNOSIS — Z419 Encounter for procedure for purposes other than remedying health state, unspecified: Secondary | ICD-10-CM | POA: Diagnosis not present

## 2023-07-01 DIAGNOSIS — Z419 Encounter for procedure for purposes other than remedying health state, unspecified: Secondary | ICD-10-CM | POA: Diagnosis not present

## 2023-08-12 DIAGNOSIS — Z419 Encounter for procedure for purposes other than remedying health state, unspecified: Secondary | ICD-10-CM | POA: Diagnosis not present

## 2023-09-11 DIAGNOSIS — Z419 Encounter for procedure for purposes other than remedying health state, unspecified: Secondary | ICD-10-CM | POA: Diagnosis not present

## 2023-10-12 DIAGNOSIS — Z419 Encounter for procedure for purposes other than remedying health state, unspecified: Secondary | ICD-10-CM | POA: Diagnosis not present

## 2023-11-11 DIAGNOSIS — Z419 Encounter for procedure for purposes other than remedying health state, unspecified: Secondary | ICD-10-CM | POA: Diagnosis not present

## 2023-12-12 DIAGNOSIS — Z419 Encounter for procedure for purposes other than remedying health state, unspecified: Secondary | ICD-10-CM | POA: Diagnosis not present

## 2024-04-09 ENCOUNTER — Encounter (HOSPITAL_BASED_OUTPATIENT_CLINIC_OR_DEPARTMENT_OTHER): Payer: MEDICAID | Admitting: Obstetrics and Gynecology

## 2024-04-15 ENCOUNTER — Encounter (HOSPITAL_BASED_OUTPATIENT_CLINIC_OR_DEPARTMENT_OTHER): Payer: Self-pay

## 2024-04-15 ENCOUNTER — Encounter (HOSPITAL_BASED_OUTPATIENT_CLINIC_OR_DEPARTMENT_OTHER): Payer: MEDICAID | Admitting: Certified Nurse Midwife

## 2024-04-15 NOTE — Progress Notes (Deleted)
 33 y.o. [id number] Single Black or African American female here for annual exam.    No LMP recorded.          Sexually active: {yes no:314532}  The current method of family planning is {contraception:315051}.     The pregnancy intention screening data noted above was reviewed. Potential methods of contraception were discussed. The patient elected to proceed with No data recorded.  Exercising: {yes no:314532}  {types:19826} Smoker:  {YES [id number]  Health Maintenance: Pap:  09/01/2021 NILM w/HPV negative History of abnormal Pap:  {YES NO:22349} Screening Labs: ***   reports that she has been smoking cigarettes. She has a 0.8 pack-year smoking history. She has never used smokeless tobacco. She reports that she does not currently use alcohol. She reports current drug use. Drugs: Marijuana and Cocaine.  Past Medical History:  Diagnosis Date   Anxiety    Depression    GERD (gastroesophageal reflux disease)    Gonorrhea    Headache    Herpes simplex    hsv (outbreak over a year ago)   Mental disorder    Syphilis    Trichomonas     Past Surgical History:  Procedure Laterality Date   CESAREAN SECTION N/A 01/26/2022   Procedure: CESAREAN SECTION;  Surgeon: Ozan, Jennifer, DO;  Location: [facility] LD ORS;  Service: Obstetrics;  Laterality: N/A;   TOOTH EXTRACTION      Current Outpatient Medications  Medication Sig Dispense Refill   acetaminophen  (TYLENOL ) 325 MG tablet Take 2 tablets (650 mg total) by mouth every 4 (four) hours as needed for mild pain (temperature > 101.5.). 30 tablet 0   FLUoxetine  (PROZAC ) 20 MG capsule Take 1 capsule (20 mg total) by mouth daily. 30 capsule 3   ibuprofen  (ADVIL ) 600 MG tablet Take 600 mg by mouth every 6 (six) hours as needed for mild pain or moderate pain.     misoprostol  (CYTOTEC ) 200 MCG tablet Place four tablets in between your gums and cheeks (two tablets on each side) as instructed. If no period in 24 hours, take another four tablets. 8 tablet 0    ondansetron  (ZOFRAN -ODT) 4 MG disintegrating tablet Take 1 tablet (4 mg total) by mouth every 8 (eight) hours as needed for nausea or vomiting. 15 tablet 0   oxyCODONE  (ROXICODONE ) 5 MG immediate release tablet Take 1 tablet (5 mg total) by mouth every 6 (six) hours as needed for moderate pain (pain score 4-6). 15 tablet 0   No current facility-administered medications for this visit.    Family History  Problem Relation Age of Onset   Asthma Son    Cancer Maternal Grandmother    Diabetes Maternal Grandmother    Heart disease Neg Hx    Hypertension Neg Hx    Stroke Neg Hx     ROS: Constitutional: {Findings; ROS constitutional:30497::negative} Genitourinary:{Findings; ROS genitourinary:19593::negative}  Exam:   There were no vitals taken for this visit.     General appearance: alert, cooperative and appears stated age Head: Normocephalic, without obvious abnormality, atraumatic Neck: no adenopathy, supple, symmetrical, trachea midline and thyroid {EXAM; THYROID:18604} Lungs: clear to auscultation bilaterally Breasts: {Exam; breast:13139::normal appearance, no masses or tenderness} Heart: regular rate and rhythm Abdomen: soft, non-tender; bowel sounds normal; no masses,  no organomegaly Extremities: extremities normal, atraumatic, no cyanosis or edema Skin: Skin color, texture, turgor normal. No rashes or lesions Lymph nodes: Cervical, supraclavicular, and axillary nodes normal. No abnormal inguinal nodes palpated Neurologic: Grossly normal   Pelvic: External genitalia:  no lesions  Urethra:  normal appearing urethra with no masses, tenderness or lesions              Bartholins and Skenes: normal                 Vagina: normal appearing vagina with normal color and no discharge, no lesions              Cervix: {exam; cervix:14595}              Pap taken: {yes no:314532} Bimanual Exam:  Uterus:  {exam; uterus:12215}              Adnexa: {exam; adnexa:12223}                Rectovaginal: Confirms               Anus:  normal sphincter tone, no lesions  Chaperone, ***, CMA, was present for exam.  Assessment/Plan:
